# Patient Record
Sex: Male | Born: 1970 | Race: Black or African American | Hispanic: No | Marital: Single | Smoking: Current every day smoker
Health system: Southern US, Community
[De-identification: ages and names within clinical notes are randomized; demographics above are authoritative.]

## PROBLEM LIST (undated history)

## (undated) DIAGNOSIS — K219 Gastro-esophageal reflux disease without esophagitis: Secondary | ICD-10-CM

## (undated) HISTORY — DX: Gastro-esophageal reflux disease without esophagitis: K21.9

---

## 1999-04-01 ENCOUNTER — Emergency Department (HOSPITAL_COMMUNITY): Admission: EM | Admit: 1999-04-01 | Discharge: 1999-04-01 | Payer: Self-pay | Admitting: Emergency Medicine

## 2013-10-22 ENCOUNTER — Emergency Department (INDEPENDENT_AMBULATORY_CARE_PROVIDER_SITE_OTHER)
Admission: EM | Admit: 2013-10-22 | Discharge: 2013-10-22 | Disposition: A | Discharge status: Home / Self Care | Payer: Self-pay | Source: Home / Self Care | Attending: Emergency Medicine | Admitting: Emergency Medicine

## 2013-10-22 ENCOUNTER — Encounter (HOSPITAL_COMMUNITY): Payer: Self-pay | Admitting: Emergency Medicine

## 2013-10-22 ENCOUNTER — Emergency Department (INDEPENDENT_AMBULATORY_CARE_PROVIDER_SITE_OTHER): Payer: Self-pay

## 2013-10-22 DIAGNOSIS — S59909A Unspecified injury of unspecified elbow, initial encounter: Secondary | ICD-10-CM

## 2013-10-22 DIAGNOSIS — S59919A Unspecified injury of unspecified forearm, initial encounter: Secondary | ICD-10-CM

## 2013-10-22 DIAGNOSIS — S6990XA Unspecified injury of unspecified wrist, hand and finger(s), initial encounter: Secondary | ICD-10-CM

## 2013-10-22 DIAGNOSIS — S59911A Unspecified injury of right forearm, initial encounter: Secondary | ICD-10-CM

## 2013-10-22 MED ORDER — DICLOFENAC SODIUM 75 MG PO TBEC: 75.0000 mg | DELAYED_RELEASE_TABLET | Freq: Two times a day (BID) | ORAL | Status: DC

## 2013-10-22 NOTE — Discharge Instructions (Signed)
You can pick up the antiinflammatory medication at [facility]. It is twice daily. You can take Tylenol also if you need it. Wear the sling for comfort only, do no keep it on all the time. Keep the skin abrasion clean.  Follow up if you get worse or anything changes.
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
## 2013-10-23 NOTE — ED Provider Notes (Signed)
Medical screening examination/treatment/procedure(s) were performed by a resident physician and as supervising physician I was immediately available for consultation/collaboration.  Philipp Deputy, M.D.  Harden Mo, MD 10/23/13 [phone]

## 2017-08-03 ENCOUNTER — Encounter (HOSPITAL_COMMUNITY): Payer: Self-pay | Admitting: Emergency Medicine

## 2017-08-03 ENCOUNTER — Emergency Department (HOSPITAL_COMMUNITY): Payer: No Typology Code available for payment source

## 2017-08-03 ENCOUNTER — Emergency Department (HOSPITAL_COMMUNITY)
Admission: EM | Admit: 2017-08-03 | Discharge: 2017-08-03 | Disposition: A | Discharge status: Home / Self Care | Payer: No Typology Code available for payment source | Attending: Emergency Medicine | Admitting: Emergency Medicine

## 2017-08-03 DIAGNOSIS — Y9389 Activity, other specified: Secondary | ICD-10-CM | POA: Insufficient documentation

## 2017-08-03 DIAGNOSIS — W2209XA Striking against other stationary object, initial encounter: Secondary | ICD-10-CM | POA: Diagnosis not present

## 2017-08-03 DIAGNOSIS — Y929 Unspecified place or not applicable: Secondary | ICD-10-CM | POA: Diagnosis not present

## 2017-08-03 DIAGNOSIS — M25562 Pain in left knee: Secondary | ICD-10-CM

## 2017-08-03 DIAGNOSIS — S8002XA Contusion of left knee, initial encounter: Secondary | ICD-10-CM

## 2017-08-03 DIAGNOSIS — S8992XA Unspecified injury of left lower leg, initial encounter: Secondary | ICD-10-CM | POA: Diagnosis present

## 2017-08-03 DIAGNOSIS — Y999 Unspecified external cause status: Secondary | ICD-10-CM | POA: Diagnosis not present

## 2017-08-03 DIAGNOSIS — S8392XA Sprain of unspecified site of left knee, initial encounter: Secondary | ICD-10-CM | POA: Insufficient documentation

## 2017-08-03 MED ORDER — NAPROXEN 500 MG PO TABS: 500.0000 mg | ORAL_TABLET | Freq: Two times a day (BID) | ORAL | 0 refills | Status: DC | PRN

## 2017-08-03 MED ORDER — HYDROCODONE-ACETAMINOPHEN 5-325 MG PO TABS
1.0000 | ORAL_TABLET | Freq: Once | ORAL | Status: AC
  Administered 2017-08-03: 1 via ORAL
  Filled 2017-08-03: qty 1

## 2017-08-03 NOTE — ED Provider Notes (Signed)
[facility] DEPT Provider Note   CSN: [id number] Arrival date & time: 08/03/17  1836     History   Chief Complaint Chief Complaint  Patient presents with  . Knee Pain    HPI Matthew Perry is a 47 y.o. otherwise healthy male who presents to the ED with complaints of left knee pain x 5 hours.  Patient states that he was loading a beam into a truck and did not quite make it in so the beam fell, and in order to avoid being hit by the beam he attempted to jump over another being that was behind him, striking his left knee on the beam while jumping over it.  Since then he has had 10/10 constant aching nonradiating left knee pain which worsens with movement or walking, and with no treatments tried prior to arrival.  He reports associated swelling.  He denies any bruises, numbness, tingling, focal weakness, or any other injuries or complaints at this time.  He has never injured this knee before, and has no prior orthopedic care.  NKDA.  He denies having any medical conditions.    The history is provided by the patient and medical records. No language interpreter was used.  Knee Pain   This is a new problem. The current episode started 3 to 5 hours ago. The problem occurs constantly. The problem has not changed since onset.The pain is present in the left knee. The quality of the pain is described as aching. The pain is at a severity of 10/10. The pain is severe. Associated symptoms include limited range of motion (due to pain). Pertinent negatives include no numbness and no tingling. The symptoms are aggravated by standing and activity. He has tried nothing for the symptoms. The treatment provided no relief. There has been a history of trauma.    History reviewed. No pertinent past medical history.  There are no active problems to display for this patient.   History reviewed. No pertinent surgical history.     Home Medications    Prior to Admission  medications   Medication Sig Start Date End Date Taking? Authorizing Provider  diclofenac (VOLTAREN) 75 MG EC tablet Take 1 tablet (75 mg total) by mouth 2 (two) times daily. 10/22/13   Hairford, Tyler Pita, MD    Family History No family history on file.  Social History Social History   Tobacco Use  . Smoking status: Never Smoker  Substance Use Topics  . Alcohol use: Yes  . Drug use: No     Allergies   Patient has no known allergies.   Review of Systems Review of Systems  Musculoskeletal: Positive for arthralgias and joint swelling.  Skin: Negative for color change.  Allergic/Immunologic: Negative for immunocompromised state.  Neurological: Negative for tingling, weakness and numbness.  Psychiatric/Behavioral: Negative for confusion.     Physical Exam Updated Vital Signs BP (!) 155/97 (BP Location: Left Arm)   Pulse 69   Temp 98.3 F (36.8 C) (Oral)   Resp 14   SpO2 99%   Physical Exam  Constitutional: He is oriented to person, place, and time. Vital signs are normal. He appears well-developed and well-nourished.  Non-toxic appearance. No distress.  Afebrile, nontoxic, NAD  HENT:  Head: Normocephalic and atraumatic.  Mouth/Throat: Mucous membranes are normal.  Eyes: Conjunctivae and EOM are normal. Right eye exhibits no discharge. Left eye exhibits no discharge.  Neck: Normal range of motion. Neck supple.  Cardiovascular: Normal rate and intact distal pulses.  Pulmonary/Chest: Effort normal. No respiratory distress.  Abdominal: Normal appearance. He exhibits no distension.  Musculoskeletal:       Left knee: He exhibits decreased range of motion (due to pain), swelling and effusion. He exhibits no ecchymosis, no deformity, no laceration, no erythema, normal alignment, no LCL laxity, normal patellar mobility and no MCL laxity. Tenderness found. Medial joint line and lateral joint line tenderness noted.  L knee with limited ROM due to pain, with diffuse joint line TTP,  +swelling/effusion, no deformity or crepitus, no abrasions, no bruising or erythema, no warmth, no abnormal alignment or patellar mobility, no varus/valgus laxity, neg anterior drawer test.  Strength and sensation grossly intact distally, distal pulses intact, compartments soft   Neurological: He is alert and oriented to person, place, and time. He has normal strength. No sensory deficit.  Skin: Skin is warm, dry and intact. No rash noted.  Psychiatric: He has a normal mood and affect.  Nursing note and vitals reviewed.    ED Treatments / Results  Labs (all labs ordered are listed, but only abnormal results are displayed) Labs Reviewed - No data to display  EKG  EKG Interpretation None       Radiology Dg Knee Complete 4 Views Left  Result Date: 08/03/2017 CLINICAL DATA:  Left knee injury with pain. EXAM: LEFT KNEE - COMPLETE 4+ VIEW COMPARISON:  None. FINDINGS: No evidence of fracture, dislocation, or joint effusion. There is minimal tibial spine osteophytosis. Soft tissues are unremarkable. IMPRESSION: There is no acute fracture or dislocation. Electronically Signed   By: Abelardo Diesel M.D.   On: 08/03/2017 20:13    Procedures Procedures (including critical care time)  Medications Ordered in ED Medications  HYDROcodone-acetaminophen (NORCO/VICODIN) 5-325 MG per tablet 1 tablet (1 tablet Oral Given 08/03/17 1935)     Initial Impression / Assessment and Plan / ED Course  I have reviewed the triage vital signs and the nursing notes.  Pertinent labs & imaging results that were available during my care of the patient were reviewed by me and considered in my medical decision making (see chart for details).     47 y.o. male here with L knee pain after striking it on a beam while trying to jump over it to avoid being hit by another beam that was falling. On exam, diffuse joint line TTP and swelling of the L knee, no bruising or abrasions, no erythema or warmth, ROM limited due to  pain, NVI with soft compartments. Will get xray and give pain meds, then reassess shortly.   8:23 PM L knee xray negative. Likely knee contusion vs sprain. Doubt occult tibial plateau fx given mechanism. Will provide knee sleeve and crutches, advised RICE, will rx naprosyn and discussed additional tylenol use, and f/up with ortho in 1wk for recheck of symptoms. I explained the diagnosis and have given explicit precautions to return to the ER including for any other new or worsening symptoms. The patient understands and accepts the medical plan as it's been dictated and I have answered their questions. Discharge instructions concerning home care and prescriptions have been given. The patient is STABLE and is discharged to home in good condition.    Final Clinical Impressions(s) / ED Diagnoses   Final diagnoses:  Acute pain of left knee  Sprain of left knee, unspecified ligament, initial encounter  Contusion of left knee, initial encounter    ED Discharge Orders        Ordered    naproxen (NAPROSYN) 500 MG  tablet  2 times daily PRN     08/03/17 [address] [place], [place] 08/03/17 2025    Little, Wenda Overland, MD 08/04/17 [phone]

## 2017-08-03 NOTE — Discharge Instructions (Addendum)
Wear knee sleeve for at least 2 weeks for stabilization of knee. Use crutches as needed for comfort. Ice and elevate knee throughout the day, using ice pack for no more than 20 minutes every hour. Take naprosyn as directed as needed for pain and inflammation, and  use tylenol for additional pain relief. Call orthopedic follow up today or tomorrow to schedule followup appointment for recheck of ongoing knee pain in 1-2 weeks. Return to the ER for changes or worsening symptoms.

## 2017-08-03 NOTE — ED Triage Notes (Signed)
Per [facility] pt coming from home states a metal beam fell and caused him to jump. Patient did not get hit by beam. C/o left knee pain.

## 2018-07-30 ENCOUNTER — Other Ambulatory Visit: Payer: Self-pay

## 2018-07-30 ENCOUNTER — Ambulatory Visit (INDEPENDENT_AMBULATORY_CARE_PROVIDER_SITE_OTHER): Payer: PRIVATE HEALTH INSURANCE

## 2018-07-30 ENCOUNTER — Encounter (HOSPITAL_COMMUNITY): Payer: Self-pay | Admitting: Emergency Medicine

## 2018-07-30 ENCOUNTER — Ambulatory Visit (HOSPITAL_COMMUNITY)
Admission: EM | Admit: 2018-07-30 | Discharge: 2018-07-30 | Disposition: A | Discharge status: Home / Self Care | Payer: PRIVATE HEALTH INSURANCE | Attending: Urgent Care | Admitting: Urgent Care

## 2018-07-30 DIAGNOSIS — S335XXA Sprain of ligaments of lumbar spine, initial encounter: Secondary | ICD-10-CM | POA: Insufficient documentation

## 2018-07-30 DIAGNOSIS — M79605 Pain in left leg: Secondary | ICD-10-CM | POA: Insufficient documentation

## 2018-07-30 DIAGNOSIS — M5136 Other intervertebral disc degeneration, lumbar region: Secondary | ICD-10-CM | POA: Insufficient documentation

## 2018-07-30 DIAGNOSIS — M545 Low back pain: Secondary | ICD-10-CM | POA: Diagnosis not present

## 2018-07-30 MED ORDER — METHYLPREDNISOLONE SODIUM SUCC 125 MG IJ SOLR
125.0000 mg | Freq: Once | INTRAMUSCULAR | Status: AC
  Administered 2018-07-30: 125 mg via INTRAMUSCULAR

## 2018-07-30 MED ORDER — METHYLPREDNISOLONE SODIUM SUCC 125 MG IJ SOLR
INTRAMUSCULAR | Status: AC
  Filled 2018-07-30: qty 2

## 2018-07-30 MED ORDER — MELOXICAM 7.5 MG PO TABS: 7.5000 mg | ORAL_TABLET | Freq: Every day | ORAL | 1 refills | Status: DC

## 2018-07-30 MED ORDER — CYCLOBENZAPRINE HCL 5 MG PO TABS: 5.0000 mg | ORAL_TABLET | Freq: Two times a day (BID) | ORAL | 0 refills | Status: DC | PRN

## 2018-07-30 NOTE — ED Triage Notes (Signed)
Pt reports left lower back pain that radiates into his hip and into his left leg to his mid thigh, laterally.  He states he tripped up some steps two months ago and the pain started then.  Pt has been using a heating pad and NSAIDS OTC with no relief.

## 2018-07-30 NOTE — ED Provider Notes (Addendum)
MRN: [id number] DOB: [birth year]  Subjective:   Matthew Perry is a 48 y.o. male presenting for 2 month history of worsening, persistent, constant, sharp/achy/dull/throbbing left low back with radiation into left thigh. Symptoms started from falling up some steps accidentally. Has tried naproxen, diclofenac with minimal relief; these are old prescriptions. Has not seen a provider for this. He is not currently taking any medications and has no known food or drug allergies.  Denies past medical and surgical history.  Review of Systems  Constitutional: Negative for chills, fever and malaise/fatigue.  HENT: Negative for ear pain and sore throat.   Eyes: Negative for blurred vision and double vision.  Respiratory: Negative for cough, shortness of breath and wheezing.   Cardiovascular: Negative for chest pain and palpitations.  Gastrointestinal: Negative for abdominal pain, blood in stool, constipation, nausea and vomiting.  Genitourinary: Negative for dysuria, flank pain, frequency, hematuria and urgency.  Skin: Negative for rash.  Neurological: Negative for dizziness and headaches.  Psychiatric/Behavioral: Negative for depression.   Objective:   Vitals: BP (!) 149/92 (BP Location: Left Arm) Comment: Reported BP to PA [facility]  Pulse 64   Temp 98.8 F (37.1 C) (Oral)   Resp 18   SpO2 100%   Physical Exam Constitutional:      Appearance: Normal appearance. He is well-developed and normal weight.  HENT:     Head: Normocephalic and atraumatic.     Right Ear: External ear normal.     Left Ear: External ear normal.     Nose: Nose normal.     Mouth/Throat:     Pharynx: Oropharynx is clear.  Eyes:     Extraocular Movements: Extraocular movements intact.     Pupils: Pupils are equal, round, and reactive to light.  Cardiovascular:     Rate and Rhythm: Normal rate.  Pulmonary:     Effort: Pulmonary effort is normal.  Musculoskeletal:     Lumbar back: He exhibits decreased range of  motion, tenderness and spasm. He exhibits no swelling, no edema, no deformity and no laceration.       Back:  Neurological:     Mental Status: He is alert and oriented to person, place, and time.  Psychiatric:        Mood and Affect: Mood normal.        Behavior: Behavior normal.    Lumbar XR - preliminary reading by PA [facility] shows disc space narrowing at L3-L4, osteophytes anteriorly over same area. Over-read is pending.  Dg Lumbar Spine Complete  Result Date: 07/30/2018 CLINICAL DATA:  Recent trip and fall with left lower lumbar pain, initial encounter EXAM: LUMBAR SPINE - COMPLETE 4+ VIEW COMPARISON:  None. FINDINGS: Five lumbar type vertebral bodies are well visualized. Vertebral body height is well maintained. No pars defects are seen. No anterolisthesis is noted. Mild disc space narrowing at L3-4 and L4-5 is seen. IMPRESSION: Mild degenerative change without acute abnormality. Electronically Signed   By: Inez Catalina M.D.   On: 07/30/2018 10:44     Assessment and Plan :   Lumbar sprain, initial encounter  Low back pain radiating to left leg  Degenerative disc disease, lumbar  IM Solu-Medrol in clinic today.  Use meloxicam and Flexeril at home.  Counseled on back care, modification of activities.  Patient will have to days of rest from work.  Recommended he establish care with the PCP for follow-up if his back pain persist. Counseled patient on potential for adverse effects with medications prescribed today, patient  verbalized understanding.  Monitor blood pressure at home which I suspect is due to his pain and use of NSAIDs. Return-to-clinic precautions discussed, patient verbalized understanding.    Jaynee Eagles, PA-C 07/30/18 [place], [place] 07/30/18 1102

## 2019-06-23 ENCOUNTER — Ambulatory Visit (HOSPITAL_COMMUNITY)
Admission: EM | Admit: 2019-06-23 | Discharge: 2019-06-23 | Disposition: A | Discharge status: Home / Self Care | Payer: PRIVATE HEALTH INSURANCE | Attending: Family Medicine | Admitting: Family Medicine

## 2019-06-23 ENCOUNTER — Encounter (HOSPITAL_COMMUNITY): Payer: Self-pay

## 2019-06-23 ENCOUNTER — Other Ambulatory Visit: Payer: Self-pay

## 2019-06-23 DIAGNOSIS — R1013 Epigastric pain: Secondary | ICD-10-CM

## 2019-06-23 MED ORDER — OMEPRAZOLE 20 MG PO CPDR: 20.0000 mg | DELAYED_RELEASE_CAPSULE | Freq: Two times a day (BID) | ORAL | 0 refills | Status: DC

## 2019-06-23 MED ORDER — LIDOCAINE VISCOUS HCL 2 % MT SOLN
15.0000 mL | Freq: Once | OROMUCOSAL | Status: AC
  Administered 2019-06-23: 15 mL via ORAL

## 2019-06-23 MED ORDER — ALUM & MAG HYDROXIDE-SIMETH 200-200-20 MG/5ML PO SUSP
30.0000 mL | Freq: Once | ORAL | Status: AC
  Administered 2019-06-23: 18:00:00 30 mL via ORAL

## 2019-06-23 NOTE — ED Provider Notes (Addendum)
[facility]    CSN: [id number] Arrival date & time: 06/23/19  1620      History   Chief Complaint Chief Complaint  Patient presents with   Chest Pain    HPI Matthew Perry is a 48 y.o. male.   HPI  Patient complains of "chest pain".  He points to his mid epigastrium.  He states is been present for about 3 weeks.  Is getting worse.  It hurts more at night when he tries to lie down.  He does have some nausea.  Appetite diminished.  No vomiting.  No change with meals.  He is a smoker.  No alcohol.  No history of stomach problems or acid reflux.  No history of heart disease.  No hypertension, diabetes, or family history of heart disease.  Pain is not exertional.    No lightheadedness or diaphoresis. Occasionally when the pain is severe it radiates to his back No history of abdominal surgeries  History reviewed. No pertinent past medical history.  There are no active problems to display for this patient.   History reviewed. No pertinent surgical history.     Home Medications    Prior to Admission medications   Medication Sig Start Date End Date Taking? Authorizing Provider  omeprazole (PRILOSEC) 20 MG capsule Take 1 capsule (20 mg total) by mouth 2 (two) times daily before a meal. 06/23/19   Raylene Everts, MD    Family History Family History  Problem Relation Age of Onset   Multiple sclerosis Mother     Social History Social History   Tobacco Use   Smoking status: Current Every Day Smoker    Packs/day: 0.25    Types: Cigarettes    Last attempt to quit: 07/30/2018    Years since quitting: 0.8   Smokeless tobacco: Never Used  Substance Use Topics   Alcohol use: Not Currently   Drug use: Yes    Types: Marijuana     Allergies   Patient has no known allergies.   Review of Systems Review of Systems  Constitutional: Negative for chills and fever.  HENT: Negative for ear pain and sore throat.   Eyes: Negative for pain and visual  disturbance.  Respiratory: Negative for cough and shortness of breath.   Cardiovascular: Positive for chest pain. Negative for palpitations.  Gastrointestinal: Positive for abdominal pain and nausea. Negative for vomiting.  Genitourinary: Negative for dysuria and hematuria.  Musculoskeletal: Negative for arthralgias and back pain.  Skin: Negative for color change and rash.  Neurological: Negative for seizures and syncope.  All other systems reviewed and are negative.    Physical Exam Triage Vital Signs ED Triage Vitals  Enc Vitals Group     BP 06/23/19 1657 133/83     Pulse Rate 06/23/19 1657 69     Resp 06/23/19 1657 16     Temp 06/23/19 1657 98.7 F (37.1 C)     Temp Source 06/23/19 1657 Oral     SpO2 06/23/19 1657 100 %     Weight --      Height --      Head Circumference --      Peak Flow --      Pain Score 06/23/19 1655 4     Pain Loc --      Pain Edu? --      Excl. in [place]? --    No data found.  Updated Vital Signs BP 133/83 (BP Location: Right Arm)    Pulse  69    Temp 98.7 F (37.1 C) (Oral)    Resp 16    SpO2 100%       Physical Exam Constitutional:      General: He is not in acute distress.    Appearance: He is well-developed and normal weight.     Comments: Smells of tobacco  HENT:     Head: Normocephalic and atraumatic.  Eyes:     Conjunctiva/sclera: Conjunctivae normal.     Pupils: Pupils are equal, round, and reactive to light.  Neck:     Musculoskeletal: Normal range of motion.  Cardiovascular:     Rate and Rhythm: Normal rate and regular rhythm.     Heart sounds: Normal heart sounds. No systolic murmur.  Pulmonary:     Effort: Pulmonary effort is normal. No respiratory distress.     Comments: Lungs are clear Abdominal:     General: There is no distension.     Palpations: Abdomen is soft. There is no hepatomegaly or splenomegaly.     Tenderness: There is abdominal tenderness.     Comments: Tenderness to palpation of the epigastrium.  No  guarding or rebound.  No organomegaly  Musculoskeletal: Normal range of motion.  Lymphadenopathy:     Cervical: No cervical adenopathy.  Skin:    General: Skin is warm and dry.  Neurological:     General: No focal deficit present.     Mental Status: He is alert.  Psychiatric:        Mood and Affect: Mood normal.        Behavior: Behavior normal.      UC Treatments / Results  Labs (all labs ordered are listed, but only abnormal results are displayed) Labs Reviewed - No data to display  EKG EKG shows normal sinus rhythm, normal intervals, no ST or T wave changes   Radiology No results found.  Procedures Procedures (including critical care time)  Medications Ordered in UC Medications  alum & mag hydroxide-simeth (MAALOX/MYLANTA) 200-200-20 MG/5ML suspension 30 mL (30 mLs Oral Given 06/23/19 1749)    And  lidocaine (XYLOCAINE) 2 % viscous mouth solution 15 mL (15 mLs Oral Given 06/23/19 1749)    Initial Impression / Assessment and Plan / UC Course  I have reviewed the triage vital signs and the nursing notes.  Pertinent labs & imaging results that were available during my care of the patient were reviewed by me and considered in my medical decision making (see chart for details).     Patient improved after GI cocktail Final Clinical Impressions(s) / UC Diagnoses   Final diagnoses:  Dyspepsia     Discharge Instructions     Take the acid reducing pill 2 times a day Avoid spicy foods Try to reduce your cigarette smoking Take antacids like Maalox or Mylanta when your stomach hurts Follow-up with your primary care doctor    ED Prescriptions    Medication Sig Dispense Auth. Provider   omeprazole (PRILOSEC) 20 MG capsule Take 1 capsule (20 mg total) by mouth 2 (two) times daily before a meal. 60 capsule Raylene Everts, MD     PDMP not reviewed this encounter.   Raylene Everts, MD 06/23/19 1740    Raylene Everts, MD 06/23/19 [phone]

## 2019-06-23 NOTE — ED Triage Notes (Signed)
Patient presents to Urgent Care with complaints of shooting centralized chest pain that radiates to his back since 3 weeks ago. Patient reports it is worst at night when he lays flat.

## 2019-06-23 NOTE — Discharge Instructions (Addendum)
Take the acid reducing pill 2 times a day Avoid spicy foods Try to reduce your cigarette smoking Take antacids like Maalox or Mylanta when your stomach hurts Follow-up with your primary care doctor

## 2019-11-12 ENCOUNTER — Ambulatory Visit (HOSPITAL_COMMUNITY)
Admission: EM | Admit: 2019-11-12 | Discharge: 2019-11-12 | Disposition: A | Discharge status: Home / Self Care | Payer: PRIVATE HEALTH INSURANCE | Attending: Physician Assistant | Admitting: Physician Assistant

## 2019-11-12 ENCOUNTER — Other Ambulatory Visit: Payer: Self-pay

## 2019-11-12 DIAGNOSIS — M79601 Pain in right arm: Secondary | ICD-10-CM | POA: Diagnosis not present

## 2019-11-12 DIAGNOSIS — M5412 Radiculopathy, cervical region: Secondary | ICD-10-CM

## 2019-11-12 MED ORDER — IBUPROFEN 800 MG PO TABS: 800.0000 mg | ORAL_TABLET | Freq: Three times a day (TID) | ORAL | 0 refills | Status: DC

## 2019-11-12 MED ORDER — PREDNISONE 50 MG PO TABS: 50.0000 mg | ORAL_TABLET | Freq: Every day | ORAL | 0 refills | Status: DC

## 2019-11-12 MED ORDER — TIZANIDINE HCL 4 MG PO TABS: 4.0000 mg | ORAL_TABLET | Freq: Four times a day (QID) | ORAL | 0 refills | Status: DC | PRN

## 2019-11-12 NOTE — ED Triage Notes (Signed)
Pain (tingling) that starts in neck and radiates down right arm x 3 weeks

## 2019-11-12 NOTE — Discharge Instructions (Addendum)
I want you to take the prednisone: 50mg  daily for 5 days, then following completion of this, begin the ibuprofen as needed every 8 hours  Take the muscle relaxer at night. This will make you sleepy, so do not drive or drink after taking it.  Follow up with the sports medicine group for continued management and re-evaluation in 1-2 weeks  Establish with primary care to have your blood pressure rechecked

## 2019-11-12 NOTE — ED Provider Notes (Signed)
[facility]    CSN: [id number] Arrival date & time: 11/12/19  1731      History   Chief Complaint Chief Complaint  Patient presents with  . Arm Pain    HPI Matthew Perry is a 49 y.o. male.   Patient presents with 3-week history of neck pain and shooting pains down his right arm.  Reports it started as a twinge in his right side of his neck.  Which progressed to his symptoms as current.  He describes today pain starting in the right side of his neck and shooting down his right shoulder into his arm.  He describes pins-and-needles sensation in the back of his right arm.  He describes decreased sensation in his right hand.  Reports this is worse when sitting upright with his arm dangling.  Reports relief with putting his arm over his head at night and laying on the right side.  Denies injury to the neck or shoulder.  Denies any known inciting event.  Denies weakness.     No past medical history on file.  There are no problems to display for this patient.   No past surgical history on file.     Home Medications    Prior to Admission medications   Medication Sig Start Date End Date Taking? Authorizing Provider  ibuprofen (ADVIL) 800 MG tablet Take 1 tablet (800 mg total) by mouth 3 (three) times daily. 11/12/19   Rahma Meller, Marguerita Beards, PA-C  omeprazole (PRILOSEC) 20 MG capsule Take 1 capsule (20 mg total) by mouth 2 (two) times daily before a meal. 06/23/19   Raylene Everts, MD  predniSONE (DELTASONE) 50 MG tablet Take 1 tablet (50 mg total) by mouth daily with breakfast. 11/12/19   Ladon Heney, Marguerita Beards, PA-C  tiZANidine (ZANAFLEX) 4 MG tablet Take 1 tablet (4 mg total) by mouth every 6 (six) hours as needed for muscle spasms. 11/12/19   Analise Glotfelty, Marguerita Beards, PA-C    Family History Family History  Problem Relation Age of Onset  . Multiple sclerosis Mother     Social History Social History   Tobacco Use  . Smoking status: Current Every Day Smoker    Packs/day: 0.25    Types:  Cigarettes    Last attempt to quit: 07/30/2018    Years since quitting: 1.2  . Smokeless tobacco: Never Used  Substance Use Topics  . Alcohol use: Not Currently  . Drug use: Yes    Types: Marijuana     Allergies   Patient has no known allergies.   Review of Systems Review of Systems   Physical Exam Triage Vital Signs ED Triage Vitals [11/12/19 1756]  Enc Vitals Group     BP (!) 163/105     Pulse Rate 67     Resp 16     Temp 98 F (36.7 C)     Temp src      SpO2 97 %     Weight      Height      Head Circumference      Peak Flow      Pain Score 8     Pain Loc      Pain Edu?      Excl. in [employer] [place]?    No data found.  Updated Vital Signs BP (!) 163/105   Pulse 67   Temp 98 F (36.7 C)   Resp 16   SpO2 97%   Visual Acuity Right Eye Distance:   Left Eye  Distance:   Bilateral Distance:    Right Eye Near:   Left Eye Near:    Bilateral Near:     Physical Exam Vitals and nursing note reviewed.  Constitutional:      Appearance: He is well-developed.  HENT:     Head: Normocephalic and atraumatic.  Neck:     Comments: Tenderness palpation over the mid cervical region on the right side.  No midline tenderness.  Patient has full range of motion however elicited pain with lateral flexion to the right.  Pain with forward flexion.  No pain with lateral flexion to the left or extension.  No pain elicited with compression of the cervical spine. Cardiovascular:     Rate and Rhythm: Normal rate.  Pulmonary:     Effort: Pulmonary effort is normal. No respiratory distress.  Musculoskeletal:     Comments: Muscle bulk is equal bilaterally.  Tender to palpation throughout the right trapezius and posterior shoulder.  No tenderness palpation of the musculature the right arm.   Some relief of pain with hand overhead.  Strength 5/5 throughout the upper extremities bilaterally.  To include grip and finger extension with thumb opposition  Skin:    General: Skin is warm and  dry.     Capillary Refill: Capillary refill takes less than 2 seconds.  Neurological:     Mental Status: He is alert.     Comments: There is some decrease sensation in the right hand versus the left.  Mild decreased sensation over the posterior aspect of the tricep and forearm.      UC Treatments / Results  Labs (all labs ordered are listed, but only abnormal results are displayed) Labs Reviewed - No data to display  EKG   Radiology No results found.  Procedures Procedures (including critical care time)  Medications Ordered in UC Medications - No data to display  Initial Impression / Assessment and Plan / UC Course  I have reviewed the triage vital signs and the nursing notes.  Pertinent labs & imaging results that were available during my care of the patient were reviewed by me and considered in my medical decision making (see chart for details).     #Cervical radiculopathy #Right arm pain Patient is a 49 year old gentleman with symptoms consistent with a cervical radiculopathy to the right side.  No history of trauma or inciting event, will forego x-ray.  Will start on 50 mg daily x5-day prednisone regiment with following ibuprofen.  Will give muscle relaxer.  Follow-up with sports medicine recommended.  Primary care follow-up option was also given.  Patient verbalized understanding plan. Final Clinical Impressions(s) / UC Diagnoses   Final diagnoses:  Cervical radiculopathy  Right arm pain     Discharge Instructions     I want you to take the prednisone: 50mg  daily for 5 days, then following completion of this, begin the ibuprofen as needed every 8 hours  Take the muscle relaxer at night. This will make you sleepy, so do not drive or drink after taking it.  Follow up with the sports medicine group for continued management and re-evaluation in 1-2 weeks  Establish with primary care to have your blood pressure rechecked      ED Prescriptions    Medication  Sig Dispense Auth. Provider   predniSONE (DELTASONE) 50 MG tablet Take 1 tablet (50 mg total) by mouth daily with breakfast. 5 tablet Cheria Sadiq, Marguerita Beards, PA-C   ibuprofen (ADVIL) 800 MG tablet Take 1 tablet (800 mg total)  by mouth 3 (three) times daily. 21 tablet Prinston Kynard, Marguerita Beards, PA-C   tiZANidine (ZANAFLEX) 4 MG tablet Take 1 tablet (4 mg total) by mouth every 6 (six) hours as needed for muscle spasms. 30 tablet Helayne Metsker, Marguerita Beards, PA-C     PDMP not reviewed this encounter.   Purnell Shoemaker, PA-C 11/12/19 1910

## 2019-12-22 NOTE — Patient Instructions (Addendum)
It was a pleasure meeting you.  You were seen to establish care and right shoulder numbness  I have ordered an xray of your neck.   You also received your Tdapt injection today I have sent a referral to GI for colonoscopy  I encourage you to consider quitting smoking.  I am happy to help you with this process.  Please follow up in 4-6 weeks   Carollee Leitz, MD [facility]    Cervical Radiculopathy  Cervical radiculopathy means that a nerve in the neck (a cervical nerve) is pinched or bruised. This can happen because of an injury to the cervical spine (vertebrae) in the neck, or as a normal part of getting older. This can cause pain or loss of feeling (numbness) that runs from your neck all the way down to your arm and fingers. Often, this condition gets better with rest. Treatment may be needed if the condition does not get better. What are the causes?  A neck injury.  A bulging disk in your spine.  Muscle movements that you cannot control (muscle spasms).  Tight muscles in your neck due to overuse.  Arthritis.  Breakdown in the bones and joints of the spine (spondylosis) due to getting older.  Bone spurs that form near the nerves in the neck. What are the signs or symptoms?  Pain. The pain may: ? Run from the neck to the arm and hand. ? Be very bad or irritating. ? Be worse when you move your neck.  Loss of feeling or tingling in your arm or hand.  Weakness in your arm or hand, in very bad cases. How is this treated? In many cases, treatment is not needed for this condition. With rest, the condition often gets better over time. If treatment is needed, options may include:  Wearing a soft neck collar (cervical collar) for short periods of time, as told by your doctor.  Doing exercises (physical therapy) to strengthen your neck muscles.  Taking medicines.  Having shots (injections) in your spine, in very bad cases.  Having surgery. This may be needed  if other treatments do not help. The type of surgery that is used depends on the cause of your condition. Follow these instructions at home: If you have a soft neck collar:  Wear it as told by your doctor. Remove it only as told by your doctor.  Ask your doctor if you can remove the collar for cleaning and bathing. If you are allowed to remove the collar for cleaning or bathing: ? Follow instructions from your doctor about how to remove the collar safely. ? Clean the collar by wiping it with mild soap and water and drying it completely. ? Take out any removable pads in the collar every 1-2 days. Wash them by hand with soap and water. Let them air-dry completely before you put them back in the collar. ? Check your skin under the collar for redness or sores. If you see any, tell your doctor. Managing pain      Take over-the-counter and prescription medicines only as told by your doctor.  If told, put ice on the painful area. ? If you have a soft neck collar, remove it as told by your doctor. ? Put ice in a plastic bag. ? Place a towel between your skin and the bag. ? Leave the ice on for 20 minutes, 2-3 times a day.  If using ice does not help, you can try using heat. Use the heat  source that your doctor recommends, such as a moist heat pack or a heating pad. ? Place a towel between your skin and the heat source. ? Leave the heat on for 20-30 minutes. ? Remove the heat if your skin turns bright red. This is very important if you are unable to feel pain, heat, or cold. You may have a greater risk of getting burned.  You may try a gentle neck and shoulder rub (massage). Activity  Rest as needed.  Return to your normal activities as told by your doctor. Ask your doctor what activities are safe for you.  Do exercises as told by your doctor or physical therapist.  Do not lift anything that is heavier than 10 lb (4.5 kg) until your doctor tells you that it is safe. General  instructions  Use a flat pillow when you sleep.  Do not drive while wearing a soft neck collar. If you do not have a soft neck collar, ask your doctor if it is safe to drive while your neck heals.  Ask your doctor if the medicine prescribed to you requires you to avoid driving or using heavy machinery.  Do not use any products that contain nicotine or tobacco, such as cigarettes, e-cigarettes, and chewing tobacco. These can delay healing. If you need help quitting, ask your doctor.  Keep all follow-up visits as told by your doctor. This is important. Contact a doctor if:  Your condition does not get better with treatment. Get help right away if:  Your pain gets worse and is not helped with medicine.  You lose feeling or feel weak in your hand, arm, face, or leg.  You have a high fever.  You have a stiff neck.  You cannot control when you poop or pee (have incontinence).  You have trouble with walking, balance, or talking. Summary  Cervical radiculopathy means that a nerve in the neck is pinched or bruised.  A nerve can get pinched from a bulging disk, arthritis, an injury to the neck, or other causes.  Symptoms include pain, tingling, or loss of feeling that goes from the neck into the arm or hand.  Weakness in your arm or hand can happen in very bad cases.  Treatment may include resting, wearing a soft neck collar, and doing exercises. You might need to take medicines for pain. In very bad cases, shots or surgery may be needed. This information is not intended to replace advice given to you by your health care provider. Make sure you discuss any questions you have with your health care provider. Document Revised: 05/25/2018 Document Reviewed: 05/25/2018 Elsevier Patient Education  2020 [employer].

## 2019-12-22 NOTE — Progress Notes (Signed)
° ° °  SUBJECTIVE:   CHIEF COMPLAINT / HPI:  To establish care  Right shoulder and arm numbness Patient reports right shoulder and arm numbness for about 1.5 months.  He woke up one morning and felt like his neck had a twinge.  He thought this would get better with some exercise but did not and started having numbness and tingling radiating down right arm. He went to the ED in April and was thought to have cervical radiculopathy.  No images done at that time.  Recommended Sports Medicine evaluation but patient reports he has not seen them.  He reports decrease in sensation R>L.  He also reports having feeling weaker in right arm.  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     PERTINENT  PMH / PSH:  MedHx: none SurgHx: none FmHx: maternal HTN SHx: ETOH 2 beer every other week, Tobacco 1ppd if having bad day, 10cigs daily normally.  Interested in smoking cessation.  Sexually active with wife. Medications: Ibuprofen prn   OBJECTIVE:   BP 126/82    Pulse 66    Ht 5\' 10"  (1.778 m)    Wt 187 lb 8 oz (85 kg)    SpO2 97%    BMI 26.90 kg/m   General: Alert and oriented, no apparent distress  Neck: nontender to palpation, stretch pain elicited upon flexion.   MSK: Upper extremity strength 5/5 bilaterally, Lower extremity strength 5/5 bilaterally Neuro: mild decrease in sensation noted to Right arm extenting from shoulder to tip of thumb abd forefinger, Tinel's and Phalens negative.  ROM intact, motor intact  Derm: No rashes noted    ASSESSMENT/PLAN:   Cervical radiculopathy -xray cervical spine -Consider SM for evaluation after receive xray results -Patient refused trail Gabapentin -Continue to monitor -Continue Ibuprofen and Tylenol -Consider PT if no better -Follow up with PCP in 4 weeks     Carollee Leitz, MD [place]

## 2019-12-23 ENCOUNTER — Ambulatory Visit (INDEPENDENT_AMBULATORY_CARE_PROVIDER_SITE_OTHER): Payer: PRIVATE HEALTH INSURANCE | Admitting: Family Medicine

## 2019-12-23 ENCOUNTER — Ambulatory Visit (HOSPITAL_COMMUNITY)
Admission: RE | Admit: 2019-12-23 | Discharge: 2019-12-23 | Disposition: A | Discharge status: Home / Self Care | Payer: PRIVATE HEALTH INSURANCE | Source: Ambulatory Visit | Attending: Family Medicine | Admitting: Family Medicine

## 2019-12-23 ENCOUNTER — Other Ambulatory Visit: Payer: Self-pay

## 2019-12-23 ENCOUNTER — Encounter: Payer: Self-pay | Admitting: Family Medicine

## 2019-12-23 VITALS — BP 126/82 | HR 66 | Ht 70.0 in | Wt 187.5 lb

## 2019-12-23 DIAGNOSIS — Z23 Encounter for immunization: Secondary | ICD-10-CM

## 2019-12-23 DIAGNOSIS — M5412 Radiculopathy, cervical region: Secondary | ICD-10-CM | POA: Insufficient documentation

## 2019-12-23 DIAGNOSIS — Z0000 Encounter for general adult medical examination without abnormal findings: Secondary | ICD-10-CM | POA: Diagnosis not present

## 2019-12-23 NOTE — Assessment & Plan Note (Signed)
-  xray cervical spine -Consider SM for evaluation after receive xray results -Patient refused trail Gabapentin -Continue to monitor -Continue Ibuprofen and Tylenol -Consider PT if no better -Follow up with PCP in 4 weeks

## 2019-12-24 ENCOUNTER — Telehealth: Payer: Self-pay | Admitting: Family Medicine

## 2019-12-24 ENCOUNTER — Encounter: Payer: Self-pay | Admitting: Family Medicine

## 2019-12-24 NOTE — Telephone Encounter (Signed)
LVM to call office back to inform pt of below. Matthew Perry, CMA  

## 2019-12-25 NOTE — Telephone Encounter (Signed)
Contacted pt and informed him of below and appointment scheduled for next Tuesday.Kerry Chisolm Zimmerman Rumple, CMA

## 2019-12-27 ENCOUNTER — Encounter: Payer: Self-pay | Admitting: Gastroenterology

## 2019-12-30 NOTE — Patient Instructions (Addendum)
It was a pleasure seeing you today.  You were seen for shoulder pain  I sent a referral to Sports Medicine for evaluate.  They will call you with an appointment  Take Flexeril to take at night.  DO NOT take Zanaflex while taking Flexeril. Apply Voltaren gel 4 times a day and take Tylenol every twice a day   Carollee Leitz, MD [facility]    Muscle Strain A muscle strain is an injury that happens when a muscle is stretched longer than normal. This can happen during a fall, sports, or lifting. This can tear some muscle fibers. Usually, recovery from muscle strain takes 1-2 weeks. Complete healing normally takes 5-6 weeks. This condition is first treated with PRICE therapy. This involves:  Protecting your muscle from being injured again.  Resting your injured muscle.  Icing your injured muscle.  Applying pressure (compression) to your injured muscle. This may be done with a splint or elastic bandage.  Raising (elevating) your injured muscle. Your doctor may also recommend medicine for pain. Follow these instructions at home: If you have a splint:  Wear the splint as told by your doctor. Take it off only as told by your doctor.  Loosen the splint if your fingers or toes tingle, get numb, or turn cold and blue.  Keep the splint clean.  If the splint is not waterproof: ? Do not let it get wet. ? Cover it with a watertight covering when you take a bath or a shower. Managing pain, stiffness, and swelling   If directed, put ice on your injured area. ? If you have a removable splint, take it off as told by your doctor. ? Put ice in a plastic bag. ? Place a towel between your skin and the bag. ? Leave the ice on for 20 minutes, 2-3 times a day.  Move your fingers or toes often. This helps to avoid stiffness and lessen swelling.  Raise your injured area above the level of your heart while you are sitting or lying down.  Wear an elastic bandage as told by your  doctor. Make sure it is not too tight. General instructions  Take over-the-counter and prescription medicines only as told by your doctor.  Limit your activity. Rest your injured muscle as told by your doctor. Your doctor may say that gentle movements are okay.  If physical therapy was prescribed, do exercises as told by your doctor.  Do not put pressure on any part of the splint until it is fully hardened. This may take many hours.  Do not use any products that contain nicotine or tobacco, such as cigarettes and e-cigarettes. These can delay bone healing. If you need help quitting, ask your doctor.  Warm up before you exercise. This helps to prevent more muscle strains.  Ask your doctor when it is safe to drive if you have a splint.  Keep all follow-up visits as told by your doctor. This is important. Contact a doctor if:  You have more pain or swelling in your injured area. Get help right away if:  You have any of these problems in your injured area: ? You have numbness. ? You have tingling. ? You lose a lot of strength. Summary  A muscle strain is an injury that happens when a muscle is stretched longer than normal.  This condition is first treated with PRICE therapy. This includes protecting, resting, icing, adding pressure, and raising your injury.  Limit your activity. Rest your injured muscle  as told by your doctor. Your doctor may say that gentle movements are okay.  Warm up before you exercise. This helps to prevent more muscle strains. This information is not intended to replace advice given to you by your health care provider. Make sure you discuss any questions you have with your health care provider. Document Revised: 08/30/2018 Document Reviewed: 08/10/2016 Elsevier Patient Education  [place].   Neck Exercises Ask your health care provider which exercises are safe for you. Do exercises exactly as told by your health care provider and adjust them as  directed. It is normal to feel mild stretching, pulling, tightness, or discomfort as you do these exercises. Stop right away if you feel sudden pain or your pain gets worse. Do not begin these exercises until told by your health care provider. Neck exercises can be important for many reasons. They can improve strength and maintain flexibility in your neck, which will help your upper back and prevent neck pain. Stretching exercises Rotation neck stretching  1. Sit in a chair or stand up. 2. Place your feet flat on the floor, shoulder width apart. 3. Slowly turn your head (rotate) to the right until a slight stretch is felt. Turn it all the way to the right so you can look over your right shoulder. Do not tilt or tip your head. 4. Hold this position for 10-30 seconds. 5. Slowly turn your head (rotate) to the left until a slight stretch is felt. Turn it all the way to the left so you can look over your left shoulder. Do not tilt or tip your head. 6. Hold this position for 10-30 seconds. Repeat __________ times. Complete this exercise __________ times a day. Neck retraction 1. Sit in a sturdy chair or stand up. 2. Look straight ahead. Do not bend your neck. 3. Use your fingers to push your chin backward (retraction). Do not bend your neck for this movement. Continue to face straight ahead. If you are doing the exercise properly, you will feel a slight sensation in your throat and a stretch at the back of your neck. 4. Hold the stretch for 1-2 seconds. Repeat __________ times. Complete this exercise __________ times a day. Strengthening exercises Neck press 1. Lie on your back on a firm bed or on the floor with a pillow under your head. 2. Use your neck muscles to push your head down on the pillow and straighten your spine. 3. Hold the position as well as you can. Keep your head facing up (in a neutral position) and your chin tucked. 4. Slowly count to 5 while holding this position. Repeat  __________ times. Complete this exercise __________ times a day. Isometrics These are exercises in which you strengthen the muscles in your neck while keeping your neck still (isometrics). 1. Sit in a supportive chair and place your hand on your forehead. 2. Keep your head and face facing straight ahead. Do not flex or extend your neck while doing isometrics. 3. Push forward with your head and neck while pushing back with your hand. Hold for 10 seconds. 4. Do the sequence again, this time putting your hand against the back of your head. Use your head and neck to push backward against the hand pressure. 5. Finally, do the same exercise on either side of your head, pushing sideways against the pressure of your hand. Repeat __________ times. Complete this exercise __________ times a day. Prone head lifts 1. Lie face-down (prone position), resting on your elbows  so that your chest and upper back are raised. 2. Start with your head facing downward, near your chest. Position your chin either on or near your chest. 3. Slowly lift your head upward. Lift until you are looking straight ahead. Then continue lifting your head as far back as you can comfortably stretch. 4. Hold your head up for 5 seconds. Then slowly lower it to your starting position. Repeat __________ times. Complete this exercise __________ times a day. Supine head lifts 1. Lie on your back (supine position), bending your knees to point to the ceiling and keeping your feet flat on the floor. 2. Lift your head slowly off the floor, raising your chin toward your chest. 3. Hold for 5 seconds. Repeat __________ times. Complete this exercise __________ times a day. Scapular retraction 1. Stand with your arms at your sides. Look straight ahead. 2. Slowly pull both shoulders (scapulae) backward and downward (retraction) until you feel a stretch between your shoulder blades in your upper back. 3. Hold for 10-30 seconds. 4. Relax and  repeat. Repeat __________ times. Complete this exercise __________ times a day. Contact a health care provider if:  Your neck pain or discomfort gets much worse when you do an exercise.  Your neck pain or discomfort does not improve within 2 hours after you exercise. If you have any of these problems, stop exercising right away. Do not do the exercises again unless your health care provider says that you can. Get help right away if:  You develop sudden, severe neck pain. If this happens, stop exercising right away. Do not do the exercises again unless your health care provider says that you can. This information is not intended to replace advice given to you by your health care provider. Make sure you discuss any questions you have with your health care provider. Document Revised: 05/02/2018 Document Reviewed: 05/02/2018 Elsevier Patient Education  [place].

## 2019-12-30 NOTE — Progress Notes (Signed)
° ° °  SUBJECTIVE:   CHIEF COMPLAINT / HPI:  Shoulder and back pain  Seen last week for same pain.  Reports that he had taken Ibuprofen once without relief.  Has been doing some exercises as instructed last week but not consistent.  He reports that he is now have a stretching pain in his back along his Right shoulder blade.  He reports that he is still having some intermittent numbness down his right arm and thumb but is improving.  He has difficulty sleeping at night secondary to pain.  Denies any weakness.  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 C-spine xrays on 06/07 normal exam.    PERTINENT  PMH / PSH:    OBJECTIVE:   BP 110/86    Pulse 79    Ht 5\' 10"  (1.778 m)    Wt 183 lb 6.4 oz (83.2 kg)    SpO2 100%    BMI 26.32 kg/m    General: Alert and oriented, no apparent distress  Neck: nontender MSK: Upper extremity strength 5/5 bilaterally, ROM intact, tenderness to posterior scapular area, no weakness      ASSESSMENT/PLAN:   Cervical radiculopathy -Tylenol CR 1300 mg BID x 14days -Diclofenac gel QID -Flexeril 5mg  qhs x14 days, STOP Zanaflex -Refer to Sports Medicine for evaluation  -Shoulder and neck exercises provided -Follow up as needed     Carollee Leitz, MD [place]

## 2019-12-31 ENCOUNTER — Encounter: Payer: Self-pay | Admitting: Family Medicine

## 2019-12-31 ENCOUNTER — Ambulatory Visit (INDEPENDENT_AMBULATORY_CARE_PROVIDER_SITE_OTHER): Payer: PRIVATE HEALTH INSURANCE | Admitting: Family Medicine

## 2019-12-31 ENCOUNTER — Other Ambulatory Visit: Payer: Self-pay

## 2019-12-31 VITALS — BP 110/86 | HR 79 | Ht 70.0 in | Wt 183.4 lb

## 2019-12-31 DIAGNOSIS — Z0000 Encounter for general adult medical examination without abnormal findings: Secondary | ICD-10-CM | POA: Diagnosis not present

## 2019-12-31 DIAGNOSIS — M5412 Radiculopathy, cervical region: Secondary | ICD-10-CM

## 2019-12-31 MED ORDER — ACETAMINOPHEN ER 650 MG PO TBCR: 1300.0000 mg | EXTENDED_RELEASE_TABLET | Freq: Two times a day (BID) | ORAL | 0 refills | Status: AC

## 2019-12-31 MED ORDER — CYCLOBENZAPRINE HCL 5 MG PO TABS: 5.0000 mg | ORAL_TABLET | Freq: Every day | ORAL | 0 refills | Status: DC

## 2019-12-31 MED ORDER — DICLOFENAC SODIUM 1 % EX GEL: 4.0000 g | Freq: Four times a day (QID) | CUTANEOUS | 1 refills | Status: DC

## 2020-01-01 LAB — HIV ANTIBODY (ROUTINE TESTING W REFLEX): HIV Screen 4th Generation wRfx: NONREACTIVE

## 2020-01-02 ENCOUNTER — Encounter: Payer: Self-pay | Admitting: Family Medicine

## 2020-01-02 NOTE — Assessment & Plan Note (Addendum)
-  Tylenol CR 1300 mg BID x 14days -Diclofenac gel QID -Flexeril 5mg  qhs x14 days, STOP Zanaflex -Refer to Sports Medicine for evaluation  -Shoulder and neck exercises provided -Follow up as needed

## 2020-01-10 ENCOUNTER — Ambulatory Visit (INDEPENDENT_AMBULATORY_CARE_PROVIDER_SITE_OTHER): Payer: PRIVATE HEALTH INSURANCE | Admitting: Family Medicine

## 2020-01-10 ENCOUNTER — Ambulatory Visit: Payer: Self-pay

## 2020-01-10 ENCOUNTER — Other Ambulatory Visit: Payer: Self-pay

## 2020-01-10 VITALS — BP 138/88 | Ht 70.0 in | Wt 185.0 lb

## 2020-01-10 DIAGNOSIS — M25511 Pain in right shoulder: Secondary | ICD-10-CM | POA: Diagnosis not present

## 2020-01-10 DIAGNOSIS — M75111 Incomplete rotator cuff tear or rupture of right shoulder, not specified as traumatic: Secondary | ICD-10-CM

## 2020-01-10 MED ORDER — NITROGLYCERIN 0.2 MG/HR TD PT24: MEDICATED_PATCH | TRANSDERMAL | 2 refills | Status: DC

## 2020-01-10 MED ORDER — METHYLPREDNISOLONE ACETATE 40 MG/ML IJ SUSP
40.0000 mg | Freq: Once | INTRAMUSCULAR | Status: AC
  Administered 2020-01-10: 40 mg via INTRA_ARTICULAR

## 2020-01-10 NOTE — Patient Instructions (Signed)
We did an ultrasound of your shoulder which shows an intrasubstance tear of one of the rotator cuff muscles.  The muscle is not torn all the way through so you do not need surgery.  This is a very aggravating injury though and can take 2 to 3 months to heal.  We are giving you some exercises to do.  How quickly you heal will depend on how diligent you are with the exercises to some extent.  We also gave you a corticosteroid injection today which should help some with the pain over the next few weeks.  You can also use over-the-counter medications and rubs for pain relief.  We also decided today after our discussion to try nitroglycerin patch to see if we can speed up this healing.  I have put the instructions below.  We discussed today in clinic but if you have any questions, please do not hesitate to call our office.  I would like to see you back in 4 to 5 weeks.  Certainly if you have any questions or problems in the meantime, give [place] a call.  I think it is fine for you to go ahead and do your current work.  I do not think that is going to further injure your rotator cuff however it will continue to be painful as it has for the last 2 months.  Healing of this type of injury is gradual in size said above it will take 2 to 3 months probably before you are totally healed.  Cut patch into one - fourth pieces Place a one fourth piece of patch on  skin over affected area, changing to a new piece every 24 hours.   You may experience a headache during the first 1-2 days and maybe up to 2 weeks of using the patch; these should improve and go away. If you experience headaches after beginning nitroglycerin patch treatment, you may take your preferred over the counter pain medicine such as tylenol or advil as directed on the box. Another side effect of the nitroglycerin patch can be skin irritation  or rash related to the patch adhesive.   Please notify our office if you develop  severe headaches or rash, and  stop the patch.  Please call our office with any questions or problems. Tendon healing with nitroglycerin patch may require 12 to 24 weeks depending on the extent of injury. Men should not use if taking Viagra, Cialis, or Levitra as it may cause an unsafe lowering of the blood pressure..  Use with caution if you have migraines or rosacea.  It was very nice to meet you and I will see you back in 4 to 6 weeks.

## 2020-01-10 NOTE — Progress Notes (Signed)
    Matthew Perry

## 2020-01-11 DIAGNOSIS — M75111 Incomplete rotator cuff tear or rupture of right shoulder, not specified as traumatic: Secondary | ICD-10-CM | POA: Insufficient documentation

## 2020-01-11 NOTE — Progress Notes (Signed)
  Matthew Perry - 49 y.o. male MRN [id number]  Date of birth: [birth year]    SUBJECTIVE:      Chief complaint Right shoulder and neck pain.  Primarily in the shoulder.  Worse with certain motions such as forward flexion or lateral abduction.  Is been going on since about April or May.  Just woke up one morning and noticed it was sore and then it has continued to get worse.  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  Has some pain while he is working but does not impede his activities.  He occasionally has pain shooting down his right arm but never up into his neck.  He has no numbness or tingling and no loss of strength in the right upper extremity.  Never had problems with this before.  No specific inciting injury.  Has had no shoulder surgeries. Current daily smoker.  No history of diabetes mellitus.   OBJECTIVE: BP 138/88   Ht 5\' 10"  (1.778 m)   Wt 185 lb (83.9 kg)   BMI 26.54 kg/m   Physical Exam:  Vital signs are reviewed. GENERAL: Well-developed male no acute distress NECK: Full range of motion.  Nontender cervical vertebra to palpation and percussion.  He has full range of motion that is painless.  Spurling's is negative bilaterally. MSK: Bilateral upper extremity have 5 out of 5 strength in C4-5 and 6 SHOULDER: Right.  Mildly tender palpation over the proximal bicep tendon but this does not totally reproduce his pain.  He has some pain but full range of motion in lateral abduction and forward flexion above shoulder height.  Distally he is neurovascularly intact bilaterally. ULTRASOUND: Biceps tendon is intact without any sign of defect.  Small amount of fluid in the bicipital tendon sheath.  Supraspinatus tendon reveals 2 midsubstance tears with some calcifications.  There is slight increased Doppler activity in this area.  The subscapularis, teres minor and infraspinatus tendons are seen well without any significant defect.  PROCEDURE: INJECTION: Patient was given informed  consent, signed copy in the chart. Appropriate time out was taken. Area prepped and draped in usual sterile fashion. Ethyl chloride was  used for local anesthesia. A 21 gauge 1 1/2 inch needle was used.. 1 cc of methylprednisolone 40 mg/ml plus 4 cc of 1% lidocaine without epinephrine was injected into the subacromial bursa using a(n) posterior approach.   The patient tolerated the procedure well. There were no complications. Post procedure instructions were given.   ASSESSMENT & PLAN:  See problem based charting & AVS for pt instructions. No problem-specific Assessment & Plan notes found for this encounter.

## 2020-01-11 NOTE — Assessment & Plan Note (Signed)
Discussed options.  Do not think he needs surgery.  He has mostly intact strength which makes me think this is more of an irritation rather than an acute new tear.  He does not use steroids, he is not diabetic and he had no known inciting injury.  He is a smoker. Home exercise program, nitroglycerin patch.  We also gave him a corticosteroid injection in the subacromial bursa today to see if we can give him some relief so he contact the home exercise program.  No work restrictions.  Follow-up 2 to 4 weeks.  Call in the interim with new or worsening symptoms.

## 2020-02-18 ENCOUNTER — Other Ambulatory Visit: Payer: Self-pay

## 2020-02-18 ENCOUNTER — Ambulatory Visit (AMBULATORY_SURGERY_CENTER): Payer: Self-pay | Admitting: *Deleted

## 2020-02-18 ENCOUNTER — Encounter: Payer: Self-pay | Admitting: Gastroenterology

## 2020-02-18 VITALS — Ht 70.5 in | Wt 187.6 lb

## 2020-02-18 DIAGNOSIS — Z01818 Encounter for other preprocedural examination: Secondary | ICD-10-CM

## 2020-02-18 DIAGNOSIS — Z1211 Encounter for screening for malignant neoplasm of colon: Secondary | ICD-10-CM

## 2020-02-18 MED ORDER — SUTAB 1479-225-188 MG PO TABS: 1.0000 | ORAL_TABLET | ORAL | 0 refills | Status: DC

## 2020-02-18 NOTE — Progress Notes (Signed)
Patient denies any allergies to egg or soy products. Patient denies complications with anesthesia/sedation.  Patient has never had any anesthesia, no prior surgery history.  Emmi instructions for colonoscopy explained and given to patient.  Covid test scheduled on 02/27/20 at 3:20 pm.

## 2020-02-27 ENCOUNTER — Ambulatory Visit (INDEPENDENT_AMBULATORY_CARE_PROVIDER_SITE_OTHER): Payer: PRIVATE HEALTH INSURANCE

## 2020-02-27 ENCOUNTER — Other Ambulatory Visit: Payer: Self-pay | Admitting: Gastroenterology

## 2020-02-27 DIAGNOSIS — Z1159 Encounter for screening for other viral diseases: Secondary | ICD-10-CM

## 2020-02-27 LAB — SARS CORONAVIRUS 2 (TAT 6-24 HRS): SARS Coronavirus 2: NEGATIVE

## 2020-03-02 ENCOUNTER — Other Ambulatory Visit: Payer: Self-pay

## 2020-03-02 ENCOUNTER — Encounter: Payer: Self-pay | Admitting: Gastroenterology

## 2020-03-02 ENCOUNTER — Ambulatory Visit (AMBULATORY_SURGERY_CENTER): Payer: PRIVATE HEALTH INSURANCE | Admitting: Gastroenterology

## 2020-03-02 VITALS — BP 144/93 | HR 52 | Temp 97.7°F | Resp 21 | Ht 70.0 in | Wt 187.6 lb

## 2020-03-02 DIAGNOSIS — K635 Polyp of colon: Secondary | ICD-10-CM

## 2020-03-02 DIAGNOSIS — K621 Rectal polyp: Secondary | ICD-10-CM | POA: Diagnosis not present

## 2020-03-02 DIAGNOSIS — Z1211 Encounter for screening for malignant neoplasm of colon: Secondary | ICD-10-CM

## 2020-03-02 DIAGNOSIS — D128 Benign neoplasm of rectum: Secondary | ICD-10-CM

## 2020-03-02 DIAGNOSIS — D125 Benign neoplasm of sigmoid colon: Secondary | ICD-10-CM

## 2020-03-02 MED ORDER — SODIUM CHLORIDE 0.9 % IV SOLN: 500.0000 mL | Freq: Once | INTRAVENOUS | Status: DC

## 2020-03-02 NOTE — Progress Notes (Signed)
Called to room to assist during endoscopic procedure.  Patient ID and intended procedure confirmed with present staff. Received instructions for my participation in the procedure from the performing physician.  

## 2020-03-02 NOTE — Progress Notes (Signed)
Report to PACU, RN, vss, BBS= Clear.  

## 2020-03-02 NOTE — Progress Notes (Signed)
VS-CW  Pt's states no medical or surgical changes since previsit or office visit.  

## 2020-03-02 NOTE — Op Note (Signed)
[facility] Patient Name: Matthew Perry Procedure Date: 03/02/2020 8:24 AM MRN: [id number] Endoscopist: Mauri Pole , MD Age: 49 Referring MD:  Date of Birth: 1970-10-17 Gender: Male Account #: [id number] Procedure:                Colonoscopy Indications:              Screening for colorectal malignant neoplasm Medicines:                Monitored Anesthesia Care Procedure:                Pre-Anesthesia Assessment:                           - Prior to the procedure, a History and Physical                            was performed, and patient medications and                            allergies were reviewed. The patient's tolerance of                            previous anesthesia was also reviewed. The risks                            and benefits of the procedure and the sedation                            options and risks were discussed with the patient.                            All questions were answered, and informed consent                            was obtained. Prior Anticoagulants: The patient has                            taken no previous anticoagulant or antiplatelet                            agents. ASA Grade Assessment: II - A patient with                            mild systemic disease. After reviewing the risks                            and benefits, the patient was deemed in                            satisfactory condition to undergo the procedure.                           After obtaining informed consent, the colonoscope  was passed under direct vision. Throughout the                            procedure, the patient's blood pressure, pulse, and                            oxygen saturations were monitored continuously. The                            Colonoscope was introduced through the anus and                            advanced to the the cecum, identified by                            appendiceal orifice and  ileocecal valve. The                            colonoscopy was performed without difficulty. The                            patient tolerated the procedure well. The quality                            of the bowel preparation was good. The ileocecal                            valve, appendiceal orifice, and rectum were                            photographed. Scope In: 8:33:21 AM Scope Out: 8:45:01 AM Scope Withdrawal Time: 0 hours 8 minutes 56 seconds  Total Procedure Duration: 0 hours 11 minutes 40 seconds  Findings:                 The perianal and digital rectal examinations were                            normal.                           Five sessile polyps were found in the rectum and                            sigmoid colon. The polyps were 1 to 3 mm in size.                            These polyps were removed with a cold biopsy                            forceps. Resection and retrieval were complete.                           Non-bleeding internal hemorrhoids were found during  retroflexion. The hemorrhoids were small.                           The exam was otherwise without abnormality. Complications:            No immediate complications. Estimated Blood Loss:     Estimated blood loss was minimal. Impression:               - Five 1 to 3 mm polyps in the rectum and in the                            sigmoid colon, removed with a cold biopsy forceps.                            Resected and retrieved.                           - Non-bleeding internal hemorrhoids.                           - The examination was otherwise normal. Recommendation:           - Patient has a contact number available for                            emergencies. The signs and symptoms of potential                            delayed complications were discussed with the                            patient. Return to normal activities tomorrow.                            Written  discharge instructions were provided to the                            patient.                           - Resume previous diet.                           - Continue present medications.                           - Await pathology results.                           - Repeat colonoscopy in 5-10 years for surveillance                            based on pathology results. Mauri Pole, MD 03/02/2020 8:49:51 AM This report has been signed electronically.

## 2020-03-02 NOTE — Patient Instructions (Signed)
HANDOUTS PROVIDED ON: POLYPS & HEMORRHOIDS ° °The polyps removed today have been sent for pathology.  The results can take 1-3 weeks to receive.  When your next colonoscopy should occur will be based on the pathology results.   ° °You may resume your previous diet and medication schedule. ° °Thank you for allowing us to care for you today!!! ° ° °YOU HAD AN ENDOSCOPIC PROCEDURE TODAY AT THE [facility]:   Refer to the procedure report that was given to you for any specific questions about what was found during the examination.  If the procedure report does not answer your questions, please call your gastroenterologist to clarify.  If you requested that your care partner not be given the details of your procedure findings, then the procedure report has been included in a sealed envelope for you to review at your convenience later. ° °YOU SHOULD EXPECT: Some feelings of bloating in the abdomen. Passage of more gas than usual.  Walking can help get rid of the air that was put into your GI tract during the procedure and reduce the bloating. If you had a lower endoscopy (such as a colonoscopy or flexible sigmoidoscopy) you may notice spotting of blood in your stool or on the toilet paper. If you underwent a bowel prep for your procedure, you may not have a normal bowel movement for a few days. ° °Please Note:  You might notice some irritation and congestion in your nose or some drainage.  This is from the oxygen used during your procedure.  There is no need for concern and it should clear up in a day or so. ° °SYMPTOMS TO REPORT IMMEDIATELY: ° °· Following lower endoscopy (colonoscopy or flexible sigmoidoscopy): ° Excessive amounts of blood in the stool ° Significant tenderness or worsening of abdominal pains ° Swelling of the abdomen that is new, acute ° Fever of 100°F or higher ° °For urgent or emergent issues, a gastroenterologist can be reached at any hour by calling [phone]. °Do not use MyChart  messaging for urgent concerns.  ° ° °DIET:  We do recommend a small meal at first, but then you may proceed to your regular diet.  Drink plenty of fluids but you should avoid alcoholic beverages for 24 hours. ° °ACTIVITY:  You should plan to take it easy for the rest of today and you should NOT DRIVE or use heavy machinery until tomorrow (because of the sedation medicines used during the test).   ° °FOLLOW UP: °Our staff will call the number listed on your records 48-72 hours following your procedure to check on you and address any questions or concerns that you may have regarding the information given to you following your procedure. If we do not reach you, we will leave a message.  We will attempt to reach you two times.  During this call, we will ask if you have developed any symptoms of COVID 19. If you develop any symptoms (ie: fever, flu-like symptoms, shortness of breath, cough etc.) before then, please call [phone].  If you test positive for Covid 19 in the 2 weeks post procedure, please call and report this information to us.   ° °If any biopsies were taken you will be contacted by phone or by letter within the next 1-3 weeks.  Please call us at [phone] if you have not heard about the biopsies in 3 weeks.  ° ° °SIGNATURES/CONFIDENTIALITY: °You and/or your care partner have signed paperwork which will be   entered into your electronic medical record.  These signatures attest to the fact that that the information above on your After Visit Summary has been reviewed and is understood.  Full responsibility of the confidentiality of this discharge information lies with you and/or your care-partner. 

## 2020-03-04 ENCOUNTER — Telehealth: Payer: Self-pay | Admitting: *Deleted

## 2020-03-04 NOTE — Telephone Encounter (Signed)
  Follow up Call-  Call back number 03/02/2020  Post procedure Call Back phone  # [phone]  Permission to leave phone message Yes  Some recent data might be hidden     Patient questions:  Do you have a fever, pain , or abdominal swelling? No. Pain Score  0 *  Have you tolerated food without any problems? Yes.    Have you been able to return to your normal activities? Yes.    Do you have any questions about your discharge instructions: Diet   No. Medications  No. Follow up visit  No.  Do you have questions or concerns about your Care? No.  Actions: * If pain score is 4 or above: 1. No action needed, pain <4.Have you developed a fever since your procedure? no  2.   Have you had an respiratory symptoms (SOB or cough) since your procedure? no  3.   Have you tested positive for COVID 19 since your procedure no  4.   Have you had any family members/close contacts diagnosed with the COVID 19 since your procedure?  no   If yes to any of these questions please route to Joylene John, RN and Joella Prince, RN

## 2020-03-10 ENCOUNTER — Encounter: Payer: Self-pay | Admitting: Gastroenterology

## 2020-04-16 ENCOUNTER — Encounter: Payer: Self-pay | Admitting: Family Medicine

## 2020-04-16 ENCOUNTER — Ambulatory Visit (INDEPENDENT_AMBULATORY_CARE_PROVIDER_SITE_OTHER): Payer: PRIVATE HEALTH INSURANCE | Admitting: Family Medicine

## 2020-04-16 ENCOUNTER — Other Ambulatory Visit: Payer: Self-pay

## 2020-04-16 VITALS — BP 132/80 | HR 83 | Ht 70.0 in | Wt 184.4 lb

## 2020-04-16 DIAGNOSIS — M75111 Incomplete rotator cuff tear or rupture of right shoulder, not specified as traumatic: Secondary | ICD-10-CM

## 2020-04-16 DIAGNOSIS — Z23 Encounter for immunization: Secondary | ICD-10-CM | POA: Insufficient documentation

## 2020-04-16 DIAGNOSIS — M25522 Pain in left elbow: Secondary | ICD-10-CM | POA: Diagnosis not present

## 2020-04-16 NOTE — Assessment & Plan Note (Signed)
Medial epicondyle pain most likely associated with wrist flexor strain and/or tendinopathy.  Patient compensating with left arm as he is avoiding use of his right arm due to rotator cuff tear. -Recommended Tylenol and ibuprofen 1 tablet each together 3-4 times daily -Cool compresses -Salonpas or other topical medications (such as diclofenac) to area to help reduce pain -Recommended follow-up with sports medicine

## 2020-04-16 NOTE — Patient Instructions (Signed)
Thank you for coming in to see [place] today! Please see below to review our plan for today's visit:  1. I recommend Tylenol 1 tablet of 500mg  with Ibuprofen 200mg  every 6-8 hours (meaning 3-4 times daily) for the next 3 days.  2. Apply cold compress to your R shoulder and left elbow before and after work.  3. Stay hydrated with water.  4. Consider SalonPas patches to shoulder.  5. Get in touch with Sports med for follow up.   Please call the clinic at [phone] if your symptoms worsen or you have any concerns. It was our pleasure to serve you!   Dr. Milus Banister [facility]

## 2020-04-16 NOTE — Assessment & Plan Note (Signed)
Patient with continued pain, has not been taking any medications over-the-counter to help reduce his pain.  Range of motion and strength are full and symmetrical bilaterally, no concern at this time for adhesive capsulitis. -Recommended Tylenol and ibuprofen 1 tablet each together 3-4 times daily to reduce pain, inflammation. -Also recommended patient using Salonpas or other topical patch or gel to reduce pain to the shoulder -Cold compresses -Encourage patient to continue his home exercise program, nitroglycerin patches to shoulder -Encouraged him to follow-up with sports medicine as needed for further imaging, management since he was previously seen by them in June.

## 2020-04-16 NOTE — Progress Notes (Signed)
SUBJECTIVE:   CHIEF COMPLAINT / HPI:   Right shoulder Pain: This is a pleasant 49 year old male patient reporting to clinic with concerns for continued right shoulder pain.  He was previously evaluated for this at the sports medicine center in June 2021 for which he received a corticosteroid injection to the shoulder.  The patient reports he is not currently taking anything to help control his pain.  He reports the pain is worse at the end of a long day.  He works a very physically demanding job.  Denies any injuries, falls.  Denies any sensation changes or numbness to the extremity.  He is right-hand dominant.  Left elbow pain: The patient reports medial left elbow pain anterior to the medial epicondyle.  He reports the pain started recently since he has been trying to use his right arm last due to his right shoulder pain and has been compensating with his left, weaker arm.  Denies any falls, traumas.  Denies any weakness, bleeding.  Was presenting to have this checked out, asked what he can do for pain control.  PERTINENT  PMH / PSH:  Patient Active Problem List   Diagnosis Date Noted  . Left elbow pain 04/16/2020  . Need for immunization against influenza 04/16/2020  . Nontraumatic incomplete tear of right rotator cuff 01/11/2020    OBJECTIVE:   BP 132/80   Pulse 83   Ht 5\' 10"  (1.778 m)   Wt 184 lb 6 oz (83.6 kg)   SpO2 98%   BMI 26.46 kg/m    Physical exam: General: Pleasant patient, no apparent distress Respiratory: Speaking complete sentences, comfortable work of breathing Right shoulder:  Inspection: no obvious deformity, atrophy, or asymmetry. No bruising. No swelling Palpation: no TTP over AC joint, moderate tenderness to bicipital groove. ROM: Full in flexion, abduction, internal/external rotation although slow to obtain these position secondary to pain NV intact distally Normal scapular function observed. Special Tests:  - Impingement: Pain with Hawkins, negative  empty can sign. - Supraspinatous: Negative empty can.  5/5 strength with resisted flexion at 20 degrees - Infraspinatous/Teres Minor: 5/5 strength with ER - Subscapularis: 5/5 strength with IR - Biceps tendon: Negative Yerrgason's  - AC Joint: Patient cannot perform cross arm test - No painful arc and no drop arm sign  Left elbow:  - Inspection: no gross deformity. No swelling/effusion, erythema or bruising. Skin intact - Palpation: no TTP - ROM: full active ROM with flexion and extension - Strength: 5/5 strength - Neuro/vasc: NV intact   ASSESSMENT/PLAN:   Nontraumatic incomplete tear of right rotator cuff Patient with continued pain, has not been taking any medications over-the-counter to help reduce his pain.  Range of motion and strength are full and symmetrical bilaterally, no concern at this time for adhesive capsulitis. -Recommended Tylenol and ibuprofen 1 tablet each together 3-4 times daily to reduce pain, inflammation. -Also recommended patient using Salonpas or other topical patch or gel to reduce pain to the shoulder -Cold compresses -Encourage patient to continue his home exercise program, nitroglycerin patches to shoulder -Encouraged him to follow-up with sports medicine as needed for further imaging, management since he was previously seen by them in June.  Left elbow pain Medial epicondyle pain most likely associated with wrist flexor strain and/or tendinopathy.  Patient compensating with left arm as he is avoiding use of his right arm due to rotator cuff tear. -Recommended Tylenol and ibuprofen 1 tablet each together 3-4 times daily -Cool compresses -Salonpas or other  topical medications (such as diclofenac) to area to help reduce pain -Recommended follow-up with sports medicine  Need for immunization against influenza -Patient received his flu vaccination today     Daisy Floro, [place]

## 2020-04-16 NOTE — Assessment & Plan Note (Signed)
-  Patient received his flu vaccination today

## 2020-04-30 ENCOUNTER — Ambulatory Visit: Payer: PRIVATE HEALTH INSURANCE | Admitting: Family Medicine

## 2020-11-23 ENCOUNTER — Other Ambulatory Visit: Payer: Self-pay

## 2020-11-23 ENCOUNTER — Ambulatory Visit (INDEPENDENT_AMBULATORY_CARE_PROVIDER_SITE_OTHER): Payer: PRIVATE HEALTH INSURANCE

## 2020-11-23 ENCOUNTER — Ambulatory Visit (HOSPITAL_COMMUNITY)
Admission: EM | Admit: 2020-11-23 | Discharge: 2020-11-23 | Disposition: A | Discharge status: Home / Self Care | Payer: PRIVATE HEALTH INSURANCE | Attending: Emergency Medicine | Admitting: Emergency Medicine

## 2020-11-23 ENCOUNTER — Encounter (HOSPITAL_COMMUNITY): Payer: Self-pay

## 2020-11-23 DIAGNOSIS — S62339A Displaced fracture of neck of unspecified metacarpal bone, initial encounter for closed fracture: Secondary | ICD-10-CM

## 2020-11-23 DIAGNOSIS — S62397A Other fracture of fifth metacarpal bone, left hand, initial encounter for closed fracture: Secondary | ICD-10-CM

## 2020-11-23 DIAGNOSIS — R2232 Localized swelling, mass and lump, left upper limb: Secondary | ICD-10-CM

## 2020-11-23 DIAGNOSIS — S6992XA Unspecified injury of left wrist, hand and finger(s), initial encounter: Secondary | ICD-10-CM

## 2020-11-23 MED ORDER — KETOROLAC TROMETHAMINE 30 MG/ML IJ SOLN
INTRAMUSCULAR | Status: AC
  Filled 2020-11-23: qty 1

## 2020-11-23 MED ORDER — IBUPROFEN 800 MG PO TABS
800.0000 mg | ORAL_TABLET | Freq: Three times a day (TID) | ORAL | 0 refills | Status: DC
Start: 2020-11-23 — End: 2021-01-12

## 2020-11-23 MED ORDER — KETOROLAC TROMETHAMINE 30 MG/ML IJ SOLN
30.0000 mg | Freq: Once | INTRAMUSCULAR | Status: AC
  Administered 2020-11-23: 30 mg via INTRAMUSCULAR

## 2020-11-23 NOTE — Discharge Instructions (Signed)
Follow up with Dr. Claudia Desanctis this week.    Take the Ibuprofen as needed for pain.  You can also take Tylenol.

## 2020-11-23 NOTE — ED Notes (Signed)
Called ortho tech regarding splint. State will come shortly.

## 2020-11-23 NOTE — ED Triage Notes (Signed)
Pt presents with swelling to the left hand x last night. Pt states he injured his hand fighting.

## 2020-11-23 NOTE — Progress Notes (Signed)
Orthopedic Tech Progress Note Patient Details:  Matthew Perry February 20, 1971 [id number] I applied and NIDONNA wrapped Ortho Devices Type of Ortho Device: Ulna gutter splint Ortho Device/Splint Location: LUE Ortho Device/Splint Interventions: Ordered,Application   Post Interventions Patient Tolerated: Well Instructions Provided: Care of [name] 11/23/2020, 4:27 PM
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
## 2020-11-26 ENCOUNTER — Ambulatory Visit (INDEPENDENT_AMBULATORY_CARE_PROVIDER_SITE_OTHER): Payer: PRIVATE HEALTH INSURANCE | Admitting: Plastic Surgery

## 2020-11-26 ENCOUNTER — Encounter: Payer: Self-pay | Admitting: Plastic Surgery

## 2020-11-26 ENCOUNTER — Other Ambulatory Visit: Payer: Self-pay

## 2020-11-26 VITALS — BP 148/83 | HR 78 | Ht 69.0 in | Wt 185.0 lb

## 2020-11-26 DIAGNOSIS — S62337A Displaced fracture of neck of fifth metacarpal bone, left hand, initial encounter for closed fracture: Secondary | ICD-10-CM

## 2020-11-26 MED ORDER — HYDROCODONE-ACETAMINOPHEN 5-325 MG PO TABS: 1.0000 | ORAL_TABLET | Freq: Four times a day (QID) | ORAL | 0 refills | Status: DC | PRN
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
## 2020-12-03 ENCOUNTER — Ambulatory Visit: Payer: PRIVATE HEALTH INSURANCE | Attending: Plastic Surgery | Admitting: Occupational Therapy

## 2020-12-03 ENCOUNTER — Other Ambulatory Visit: Payer: Self-pay

## 2020-12-03 ENCOUNTER — Encounter: Payer: Self-pay | Admitting: Occupational Therapy

## 2020-12-03 DIAGNOSIS — M25642 Stiffness of left hand, not elsewhere classified: Secondary | ICD-10-CM | POA: Diagnosis present

## 2020-12-03 DIAGNOSIS — M6281 Muscle weakness (generalized): Secondary | ICD-10-CM | POA: Insufficient documentation

## 2020-12-03 DIAGNOSIS — M79642 Pain in left hand: Secondary | ICD-10-CM | POA: Diagnosis present

## 2020-12-03 NOTE — Patient Instructions (Signed)
WEARING SCHEDULE:  Wear splint at ALL times except for hygiene care( remove splint to clean hand then put it back on immediately. Make sure hand is really dry) PURPOSE:  To prevent movement and for protection until injury can heal  CARE OF SPLINT:  Keep splint away from heat sources including: stove, radiator or furnace, or a car in sunlight. The splint can melt and will no longer fit you properly  Keep away from pets and children  Clean the splint with rubbing alcohol 1-2 times per day.  * During this time, make sure you also clean your hand/arm as instructed by your therapist and/or perform dressing changes as needed. Then dry hand/arm completely before replacing splint. (When cleaning hand/arm, keep it immobilized in same position until splint is replaced)  PRECAUTIONS/POTENTIAL PROBLEMS: *If you notice or experience increased pain, swelling, numbness, or a lingering reddened area from the splint: Contact your therapist immediately by calling [phone]. You must wear the splint for protection, but we will get you scheduled for adjustments as quickly as possible.  (If only straps or hooks need to be replaced and NO adjustments to the splint need to be made, just call the office ahead and let them know you are coming in)  If you have any medical concerns or signs of infection, please call your doctor immediately
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
## 2020-12-08 ENCOUNTER — Other Ambulatory Visit: Payer: Self-pay

## 2020-12-08 ENCOUNTER — Ambulatory Visit: Payer: PRIVATE HEALTH INSURANCE | Admitting: Occupational Therapy

## 2020-12-08 DIAGNOSIS — M79642 Pain in left hand: Secondary | ICD-10-CM | POA: Diagnosis not present

## 2020-12-08 DIAGNOSIS — M25642 Stiffness of left hand, not elsewhere classified: Secondary | ICD-10-CM

## 2020-12-08 NOTE — Therapy (Signed)
[facility] [address] [place], [place], [address] Phone: [phone]   Fax:  [phone]  Occupational Therapy Treatment  Patient Details  Name: Matthew Perry MRN: [id number] Date of Birth: 1970/11/21 Referring Provider (OT): Dr. Claudia Desanctis   Encounter Date: 12/08/2020   OT End of Session - 12/08/20 0813    Visit Number 2    Number of Visits 13    Date for OT Re-Evaluation 03/04/21    Authorization Type [employer]    OT Start Time 0755    OT Stop Time 0810    OT Time Calculation (min) 15 min    Activity Tolerance Patient tolerated treatment well           Past Medical History:  Diagnosis Date  . GERD (gastroesophageal reflux disease)     No past surgical history on file.  There were no vitals filed for this visit.   Subjective Assessment - 12/08/20 0812    Subjective  My straps are all falling off    Patient Stated Goals splint    Currently in Pain? --   not assessed, but did not appear to be in pain   Pain Onset 1 to 4 weeks ago            Pt returns today for splint adjustments. Pt reports cutting down splint with a razor to allow PIP movement at ring finger. Pt still protected at MP joints of 4th and 5th digits, but instructed pt to not make adjustments on his own going forward and allow the therapist to make adjustments prn. Pt's straps and hooks were already falling off from last week, therefore replaced all hooks and straps and provided extra hook velcro and extra straps. Pt instructed to wear one set of straps at work and one set at home.                       OT Short Term Goals - 12/08/20 0813      OT SHORT TERM GOAL #1   Title I with splint wear care and precautions    Time 6    Period Weeks    Status Achieved      OT SHORT TERM GOAL #2   Title I with inital HEP once cleared for ROM.    Time 6    Period Weeks    Status New             OT Long Term Goals - 12/03/20  1603      OT LONG TERM GOAL #1   Title I with updated HEP.    Time 12    Period Weeks    Status New      OT LONG TERM GOAL #2   Title Pt will demonstrate at least 90% composite finger flexion for LUE for increased functional use.    Time 12    Period Weeks    Status New      OT LONG TERM GOAL #3   Title Pt will demonstrate LUE grip strength of at least 40 lbs for work activities.    Time 12    Period Weeks    Status New      OT LONG TERM GOAL #4   Title Pt will report pain in L hand no greater than 3/10 with ADLs/IADLs.    Time 12    Period Weeks    Status New      OT LONG TERM GOAL #  5   Title Pt will resume use of LUE as a non dominant asssit at least 80% of the time for ADLs/IADLs.    Time 12    Period Weeks    Status New                 Plan - 12/08/20 0813    Clinical Impression Statement Pt comes today for splint adjustments. Pt still requires immobolization therefore cannot progress further at this time    OT Occupational Profile and History Problem Focused Assessment - Including review of records relating to presenting problem    Occupational performance deficits (Please refer to evaluation for details): ADL's;IADL's;Work;Leisure;Social Participation    Body Structure / Function / Physical Skills ADL;Mobility;Strength;Flexibility;FMC;Pain;Coordination;Decreased knowledge of precautions;Decreased knowledge of use of DME;GMC;ROM;Dexterity;IADL;Sensation    Rehab Potential Good    Clinical Decision Making Limited treatment options, no task modification necessary    Comorbidities Affecting Occupational Performance: May have comorbidities impacting occupational performance    Modification or Assistance to Complete Evaluation  No modification of tasks or assist necessary to complete eval    OT Frequency 1x / week   plus eval   OT Duration 12 weeks    OT Treatment/Interventions Self-care/ADL training;Therapeutic exercise;Splinting;Manual  Therapy;Ultrasound;Neuromuscular education;Therapeutic activities;Paraffin;Cryotherapy;DME and/or AE instruction;Fluidtherapy;Electrical Stimulation;Moist Heat;Contrast Bath;Passive range of motion;Patient/family education    Plan progress when able    Consulted and Agree with Plan of Care Patient           Patient will benefit from skilled therapeutic intervention in order to improve the following deficits and impairments:   Body Structure / Function / Physical Skills: ADL,Mobility,Strength,Flexibility,FMC,Pain,Coordination,Decreased knowledge of precautions,Decreased knowledge of use of DME,GMC,ROM,Dexterity,IADL,Sensation       Visit Diagnosis: Stiffness of left hand, not elsewhere classified    Problem List Patient Active Problem List   Diagnosis Date Noted  . Left elbow pain 04/16/2020  . Need for immunization against influenza 04/16/2020  . Nontraumatic incomplete tear of right rotator cuff 01/11/2020    Carey Bullocks, OTR/L 12/08/2020, 8:15 AM  [facility] [address] [place], [place], [address] Phone: [phone]   Fax:  [phone]  Name: Matthew Perry MRN: [id number] Date of Birth: 11/13/1970

## 2020-12-23 ENCOUNTER — Encounter (HOSPITAL_COMMUNITY): Payer: Self-pay

## 2020-12-23 ENCOUNTER — Ambulatory Visit (INDEPENDENT_AMBULATORY_CARE_PROVIDER_SITE_OTHER): Payer: PRIVATE HEALTH INSURANCE

## 2020-12-23 ENCOUNTER — Other Ambulatory Visit: Payer: Self-pay

## 2020-12-23 ENCOUNTER — Ambulatory Visit (HOSPITAL_COMMUNITY)
Admission: EM | Admit: 2020-12-23 | Discharge: 2020-12-23 | Disposition: A | Discharge status: Home / Self Care | Payer: PRIVATE HEALTH INSURANCE | Attending: Family Medicine | Admitting: Family Medicine

## 2020-12-23 DIAGNOSIS — R1010 Upper abdominal pain, unspecified: Secondary | ICD-10-CM

## 2020-12-23 NOTE — ED Triage Notes (Signed)
Patient states that he has had stomach cramping and pain for the past 4 days.

## 2020-12-24 NOTE — ED Provider Notes (Signed)
[facility]   [id number] 12/23/20 Arrival Time: 9678  ASSESSMENT & PLAN:  1. Pain of upper abdomen    Unclear etiology but appears to be in significant pain. Is not comfortable with conservative GI home tx. Prefers ED eval. To ED via private vehicle. Stable upon discharge. I have personally viewed the imaging studies ordered this visit. No sign of SBO on abd films.  Discharge Instructions   None     Follow-up Information     Go to  [facility].   Specialty: Emergency Medicine Contact information: [address] [id number] [place] [phone]                Reviewed expectations re: course of current medical issues. Questions answered. Outlined signs and symptoms indicating need for more acute intervention. Patient verbalized understanding. After Visit Summary given.   SUBJECTIVE: History from: patient. Matthew Perry is a 50 y.o. male who presents with complaint of persistent upper abdominal discomfort. Onset gradual,  past 4 d . Discomfort described as aching; without radiation; does not wake him at night. Reports normal flatus. Symptoms are gradually worsening since beginning. Fever: absent. Aggravating factors: have not been identified. Alleviating factors: have not been identified. Associated symptoms: mild nausea without emesic. He denies fever. Appetite: normal. PO intake: normal. Ambulatory without assistance. Urinary symptoms: none. Bowel movements: have not significantly changed. History of similar: no. OTC treatment: none PTA.  History reviewed. No pertinent surgical history.   OBJECTIVE:  Vitals:   12/23/20 1527 12/23/20 1530  BP:  129/81  Pulse:  63  Resp:  20  Temp:  98.3 F (36.8 C)  TempSrc:  Oral  SpO2:  100%  Weight: 83.9 kg   Height: 5\' 9"  (1.753 m)     General appearance: alert, oriented, no acute distress but appears uncomfortable HEENT: [place]; AT;  oropharynx moist Lungs: unlabored respirations Abdomen: soft; without distention; moderate and poorly localized tenderness to palpation over upper abdomen ; normal bowel sounds; without masses or organomegaly; without guarding or rebound tenderness Back: without reported CVA tenderness; FROM at waist Extremities: without LE edema; symmetrical; without gross deformities Skin: warm and dry Neurologic: normal gait Psychological: alert and cooperative; normal mood and affect   Imaging: DG Abd 2 Views  Result Date: 12/23/2020 CLINICAL DATA:  Upper abdominal pain and cramping. EXAM: ABDOMEN - 2 VIEW COMPARISON:  None. FINDINGS: Gas is seen in nondilated small bowel and colon. Scattered colonic stool. No unexpected radiopaque calculi. Visualized lung bases are clear. IMPRESSION: No acute findings. Electronically Signed   By: Lorin Picket M.D.   On: 12/23/2020 16:11     No Known Allergies                                             Past Medical History:  Diagnosis Date   GERD (gastroesophageal reflux disease)     Social History   Socioeconomic History   Marital status: Single    Spouse name: Not on file   Number of children: Not on file   Years of education: Not on file   Highest education level: Not on file  Occupational History   Not on file  Tobacco Use   Smoking status: Every Day    Packs/day: 0.25    Years: 31.00    Pack years: 7.75  Types: Cigarettes   Smokeless tobacco: Never  Vaping Use   Vaping Use: Never used  Substance and Sexual Activity   Alcohol use: Not Currently   Drug use: Yes    Types: Marijuana    Comment: Last use 02/17/20   Sexual activity: Yes    Birth control/protection: None  Other Topics Concern   Not on file  Social History Narrative   Not on file   Social Determinants of Health   Financial Resource Strain: Not on file  Food Insecurity: Not on file  Transportation Needs: Not on file  Physical Activity: Not on file  Stress: Not on file   Social Connections: Not on file  Intimate Partner Violence: Not on file    Family History  Problem Relation Age of Onset   Multiple sclerosis Mother    Colon cancer Neg Hx    Rectal cancer Neg Hx    Stomach cancer Neg Hx      [name], MD 12/24/20 0937

## 2020-12-31 NOTE — Progress Notes (Signed)
   Referring Provider Carollee Leitz, MD [address],  [place] [address]   CC:  Chief Complaint  Patient presents with   Follow-up      Matthew Perry is an 50 y.o. male.  HPI: 50 year old male here for follow-up on his left hand boxer fracture that he sustained early May 2022.  He saw Dr. Claudia Desanctis at this time and elected to avoid surgical intervention.  He reports today that he is overall doing well.  He has continued to work and feels as if things have been progressing.  He is not having any pain.    Per EMR review he has had 2 visits with occupational therapy since his injury and has a next appointment scheduled for next week.  He reports that he has been working on grip strength with a soft ball.  Review of Systems General: No fevers or chills  Physical Exam Vitals with BMI 12/23/2020 11/26/2020 11/23/2020  Height 5\' 9"  5\' 9"  -  Weight 185 lbs 185 lbs -  BMI 27.03 50.09 -  Systolic 381 829 937  Diastolic 81 83 89  Pulse 63 78 76    General:  No acute distress,  Alert and oriented, Non-Toxic, Normal speech and affect Left hand: Palpable radial pulse.  Swelling significantly improved.  Distal extremities warm to touch and fingers well-perfused.  Sensation intact throughout.  Good range of motion of all 5 digits.  No tenderness with range of motion but there is some slight tenderness with palpation over the fifth metacarpal head.  He is able to extend fully and nearly fully flex his 5 digits.  There is no erythema or overlying skin changes.   Assessment/Plan  Recommend continuing with therapy.  Everything appears to be doing well and the fracture appears stable.  He nearly has full function with the exception of some decreased grip strength.  Recommend following up in 4 weeks for reevaluation with Dr. Claudia Desanctis.  I did discuss that this would likely be his last appointment pending any changes.  Recommend calling with questions or concerns.  Carola Rhine Breianna Delfino 01/01/2021, 8:22 AM

## 2021-01-01 ENCOUNTER — Ambulatory Visit (INDEPENDENT_AMBULATORY_CARE_PROVIDER_SITE_OTHER): Payer: PRIVATE HEALTH INSURANCE | Admitting: Surgical

## 2021-01-01 ENCOUNTER — Other Ambulatory Visit: Payer: Self-pay

## 2021-01-01 DIAGNOSIS — S62337A Displaced fracture of neck of fifth metacarpal bone, left hand, initial encounter for closed fracture: Secondary | ICD-10-CM

## 2021-01-06 ENCOUNTER — Ambulatory Visit: Payer: PRIVATE HEALTH INSURANCE | Attending: Plastic Surgery | Admitting: Occupational Therapy

## 2021-01-06 ENCOUNTER — Other Ambulatory Visit: Payer: Self-pay

## 2021-01-06 DIAGNOSIS — M6281 Muscle weakness (generalized): Secondary | ICD-10-CM | POA: Diagnosis present

## 2021-01-06 DIAGNOSIS — M79642 Pain in left hand: Secondary | ICD-10-CM | POA: Diagnosis present

## 2021-01-06 DIAGNOSIS — M25642 Stiffness of left hand, not elsewhere classified: Secondary | ICD-10-CM | POA: Diagnosis present

## 2021-01-06 NOTE — Therapy (Signed)
[facility] [address] [place], [place], [address] Phone: [phone]   Fax:  [phone]  Occupational Therapy Treatment  Patient Details  Name: Matthew Perry MRN: [id number] Date of Birth: 24-Oct-1970 Referring Provider (OT): Dr. Claudia Desanctis   Encounter Date: 01/06/2021   OT End of Session - 01/06/21 1613     Visit Number 3    Number of Visits 13    Date for OT Re-Evaluation 03/04/21    Authorization Type [employer]    OT Start Time 0718    OT Stop Time 0800    OT Time Calculation (min) 42 min    Activity Tolerance Patient tolerated treatment well             Past Medical History:  Diagnosis Date   GERD (gastroesophageal reflux disease)     No past surgical history on file.  There were no vitals filed for this visit.   Subjective Assessment - 01/06/21 1612     Subjective  Pt reports straps are falling off of splint    Currently in Pain? Yes    Pain Score 6     Pain Location Hand    Pain Descriptors / Indicators Aching    Pain Type Surgical pain    Pain Onset More than a month ago    Pain Frequency Intermittent    Aggravating Factors  movment    Pain Relieving Factors rest                      Treatment: Pt brought in his splint, it was very soiled and straps are falling off. Therapist cleaned splint and applied new velcro while pt was in fluidotherapy. Fluidotherapy x 11 mins for pain and stiffness, no adverse reactions. Ice pack end of session x 5 mins for pain, no adverse reactions.            OT Education - 01/06/21 1618     Education Details Tendon gliding Exercises, use of buddy strap, putty HEP- yellow, see pt instructions. Splint wear and care    Person(s) Educated Patient    Methods Explanation;Demonstration;Verbal cues;Handout    Comprehension Verbalized understanding;Returned demonstration;Verbal cues required              OT Short Term Goals - 12/08/20 0813        OT SHORT TERM GOAL #1   Title I with splint wear care and precautions    Time 6    Period Weeks    Status Achieved      OT SHORT TERM GOAL #2   Title I with inital HEP once cleared for ROM.    Time 6    Period Weeks    Status New               OT Long Term Goals - 12/03/20 1603       OT LONG TERM GOAL #1   Title I with updated HEP.    Time 12    Period Weeks    Status New      OT LONG TERM GOAL #2   Title Pt will demonstrate at least 90% composite finger flexion for LUE for increased functional use.    Time 12    Period Weeks    Status New      OT LONG TERM GOAL #3   Title Pt will demonstrate LUE grip strength of at least 40 lbs for work activities.    Time 12  Period Weeks    Status New      OT LONG TERM GOAL #4   Title Pt will report pain in L hand no greater than 3/10 with ADLs/IADLs.    Time 12    Period Weeks    Status New      OT LONG TERM GOAL #5   Title Pt will resume use of LUE as a non dominant asssit at least 80% of the time for ADLs/IADLs.    Time 12    Period Weeks    Status New                   Plan - 01/06/21 1614     Clinical Impression Statement Pt is cleared for A/ROM, P/ROM and strengthening at this time per Roetta Sessions PA-C. Pt reports he was instructed top wear splint for 4 more weeks    OT Occupational Profile and History Problem Focused Assessment - Including review of records relating to presenting problem    Occupational performance deficits (Please refer to evaluation for details): ADL's;IADL's;Work;Leisure;Social Participation    Body Structure / Function / Physical Skills ADL;Mobility;Strength;Flexibility;FMC;Pain;Coordination;Decreased knowledge of precautions;Decreased knowledge of use of DME;GMC;ROM;Dexterity;IADL;Sensation    Rehab Potential Good    Clinical Decision Making Limited treatment options, no task modification necessary    Comorbidities Affecting Occupational Performance: May have  comorbidities impacting occupational performance    Modification or Assistance to Complete Evaluation  No modification of tasks or assist necessary to complete eval    OT Frequency 1x / week   plus eval   OT Duration 12 weeks    OT Treatment/Interventions Self-care/ADL training;Therapeutic exercise;Splinting;Manual Therapy;Ultrasound;Neuromuscular education;Therapeutic activities;Paraffin;Cryotherapy;DME and/or AE instruction;Fluidtherapy;Electrical Stimulation;Moist Heat;Contrast Bath;Passive range of motion;Patient/family education    Plan fluido, A/ROM, P/ROM, strengthening    Consulted and Agree with Plan of Care Patient             Patient will benefit from skilled therapeutic intervention in order to improve the following deficits and impairments:   Body Structure / Function / Physical Skills: ADL, Mobility, Strength, Flexibility, FMC, Pain, Coordination, Decreased knowledge of precautions, Decreased knowledge of use of DME, GMC, ROM, Dexterity, IADL, Sensation       Visit Diagnosis: Stiffness of left hand, not elsewhere classified  Pain in left hand  Muscle weakness (generalized)    Problem List Patient Active Problem List   Diagnosis Date Noted   Left elbow pain 04/16/2020   Need for immunization against influenza 04/16/2020   Nontraumatic incomplete tear of right rotator cuff 01/11/2020    Damaris Geers 01/06/2021, 4:19 PM Theone Murdoch, OTR/L [phone] Phone: [phone] 4:22 PM 01/06/21  [facility] [address] [place], [place], [address] Phone: [phone]   Fax:  [phone]  Name: Matthew Perry MRN: [id number] Date of Birth: April 09, 1971

## 2021-01-06 NOTE — Patient Instructions (Signed)
Flexor Tendon Gliding (Active Hook Fist)   With fingers and knuckles straight, bend middle and tip joints. Do not bend large knuckles. Repeat _10-15___ times. Do _4-6___ sessions per day.  MP Flexion (Active)   With back of hand on table, bend large knuckles as far as they will go, keeping small joints straight. Repeat _10-15___ times. Do __4-6__ sessions per day. Activity: Reach into a narrow container.*      Finger Flexion / Extension   With palm up, bend fingers of left hand toward palm, making a  fist. Straighten fingers, opening fist. Repeat sequence _10-15___ times per session. Do _4-6__ sessions per day. Hand Variation: Palm down   Copyright  VHI. All rights reserved.     1. Grip Strengthening (Resistive Putty)   Squeeze putty using thumb and all fingers. Repeat _20___ times. Do __2__ sessions per day.   2. Roll putty into tube on table and pinch between each finger and thumb x 10 reps each. (can do ring and small finger together)     Copyright  VHI. All rights reserved.

## 2021-01-12 ENCOUNTER — Encounter (HOSPITAL_COMMUNITY): Payer: Self-pay | Admitting: Emergency Medicine

## 2021-01-12 ENCOUNTER — Ambulatory Visit: Payer: PRIVATE HEALTH INSURANCE | Admitting: Occupational Therapy

## 2021-01-12 ENCOUNTER — Other Ambulatory Visit: Payer: Self-pay

## 2021-01-12 ENCOUNTER — Ambulatory Visit (INDEPENDENT_AMBULATORY_CARE_PROVIDER_SITE_OTHER)
Admission: EM | Admit: 2021-01-12 | Discharge: 2021-01-12 | Disposition: A | Discharge status: Home / Self Care | Payer: PRIVATE HEALTH INSURANCE | Source: Home / Self Care | Attending: Family Medicine | Admitting: Family Medicine

## 2021-01-12 DIAGNOSIS — J069 Acute upper respiratory infection, unspecified: Secondary | ICD-10-CM | POA: Diagnosis not present

## 2021-01-12 DIAGNOSIS — M25642 Stiffness of left hand, not elsewhere classified: Secondary | ICD-10-CM

## 2021-01-12 DIAGNOSIS — U071 COVID-19: Secondary | ICD-10-CM | POA: Insufficient documentation

## 2021-01-12 DIAGNOSIS — M6281 Muscle weakness (generalized): Secondary | ICD-10-CM

## 2021-01-12 DIAGNOSIS — F17210 Nicotine dependence, cigarettes, uncomplicated: Secondary | ICD-10-CM | POA: Insufficient documentation

## 2021-01-12 DIAGNOSIS — M79642 Pain in left hand: Secondary | ICD-10-CM

## 2021-01-12 LAB — SARS CORONAVIRUS 2 (TAT 6-24 HRS): SARS Coronavirus 2: POSITIVE — AB

## 2021-01-12 NOTE — Discharge Instructions (Signed)
You have been tested for COVID-19 today. °If your test returns positive, you will receive a phone call from [facility] regarding your results. °Negative test results are not called. °Both positive and negative results area always visible on MyChart. °If you do not have a MyChart account, sign up instructions are provided in your discharge papers. °Please do not hesitate to contact us should you have questions or concerns. ° °

## 2021-01-12 NOTE — Therapy (Signed)
[facility] [address] [place], [place], [address] Phone: [phone]   Fax:  [phone]  Occupational Therapy Treatment  Patient Details  Name: Matthew Perry MRN: [id number] Date of Birth: [birth year] Referring Provider (OT): Dr. Claudia Desanctis   Encounter Date: 01/12/2021   OT End of Session - 01/12/21 0725     Visit Number 4    Number of Visits 13    Date for OT Re-Evaluation 03/04/21    Authorization Type [employer]    OT Start Time 0718    OT Stop Time 0756    OT Time Calculation (min) 38 min             Past Medical History:  Diagnosis Date   GERD (gastroesophageal reflux disease)     No past surgical history on file.  There were no vitals filed for this visit.   Subjective Assessment - 01/12/21 0725     Subjective  Pt denies pain    Limitations cleared for A/ROM, P/ROM and strengthening, wear splint x 4 weeks from his last MD appt with Roetta Sessions, PA-C    Currently in Pain? No/denies                     Treatment: Fluidotherapy x 10 mins to LUE, no adverse reactions. Treatment: A/ROM tendon gliding exercises, isolated MP flexion and extension, finger thumb opposition Gripper set at level 3 for sustained grip, min difficulty/ v.c Graded clothespins for sustained pinch with ring and small finger 1-8 #, min difficulty v.c Placing grooved pegs in pegboard with in hand manipulation while holding several pegs in hand while placing and removing from pegboard, min difficulty/ drops.                OT Short Term Goals - 12/08/20 0813       OT SHORT TERM GOAL #1   Title I with splint wear care and precautions    Time 6    Period Weeks    Status Achieved      OT SHORT TERM GOAL #2   Title I with inital HEP once cleared for ROM.    Time 6    Period Weeks    Status New               OT Long Term Goals - 12/03/20 1603       OT LONG TERM GOAL #1   Title I with  updated HEP.    Time 12    Period Weeks    Status New      OT LONG TERM GOAL #2   Title Pt will demonstrate at least 90% composite finger flexion for LUE for increased functional use.    Time 12    Period Weeks    Status New      OT LONG TERM GOAL #3   Title Pt will demonstrate LUE grip strength of at least 40 lbs for work activities.    Time 12    Period Weeks    Status New      OT LONG TERM GOAL #4   Title Pt will report pain in L hand no greater than 3/10 with ADLs/IADLs.    Time 12    Period Weeks    Status New      OT LONG TERM GOAL #5   Title Pt will resume use of LUE as a non dominant asssit at least 80% of the time for ADLs/IADLs.  Time 12    Period Weeks    Status New                   Plan - 01/12/21 0725     Clinical Impression Statement Pt is progressing towards goals with imrproving ROM. Pt is cleared for A/ROM, P/ROM and strengthening at this time per Roetta Sessions PA-C. Pt reports he was instructed to wear splint for 3 more weeks    OT Occupational Profile and History Problem Focused Assessment - Including review of records relating to presenting problem    Occupational performance deficits (Please refer to evaluation for details): ADL's;IADL's;Work;Leisure;Social Participation    Body Structure / Function / Physical Skills ADL;Mobility;Strength;Flexibility;FMC;Pain;Coordination;Decreased knowledge of precautions;Decreased knowledge of use of DME;GMC;ROM;Dexterity;IADL;Sensation    Rehab Potential Good    Clinical Decision Making Limited treatment options, no task modification necessary    Comorbidities Affecting Occupational Performance: May have comorbidities impacting occupational performance    Modification or Assistance to Complete Evaluation  No modification of tasks or assist necessary to complete eval    OT Frequency 1x / week   plus eval   OT Duration 12 weeks    OT Treatment/Interventions Self-care/ADL training;Therapeutic  exercise;Splinting;Manual Therapy;Ultrasound;Neuromuscular education;Therapeutic activities;Paraffin;Cryotherapy;DME and/or AE instruction;Fluidtherapy;Electrical Stimulation;Moist Heat;Contrast Bath;Passive range of motion;Patient/family education    Plan fluido, A/ROM, P/ROM, strengthening    Consulted and Agree with Plan of Care Patient             Patient will benefit from skilled therapeutic intervention in order to improve the following deficits and impairments:   Body Structure / Function / Physical Skills: ADL, Mobility, Strength, Flexibility, FMC, Pain, Coordination, Decreased knowledge of precautions, Decreased knowledge of use of DME, GMC, ROM, Dexterity, IADL, Sensation       Visit Diagnosis: No diagnosis found.    Problem List Patient Active Problem List   Diagnosis Date Noted   Left elbow pain 04/16/2020   Need for immunization against influenza 04/16/2020   Nontraumatic incomplete tear of right rotator cuff 01/11/2020    Ziara Thelander 01/12/2021, 7:28 AM  [facility] [address] [place], [place], [address] Phone: [phone]   Fax:  [phone]  Name: Franklyn Cafaro MRN: [id number] Date of Birth: [birth year]

## 2021-01-12 NOTE — ED Provider Notes (Signed)
  [facility]   [id number] 01/12/21 Arrival Time: [facility] PLAN:  1. Viral URI with cough    Discussed typical duration of viral illnesses. COVID-19 testing sent. OTC symptom care as needed. Work note provided.   Follow-up Information     Carollee Leitz, MD.   Specialty: Family Medicine Why: As needed. Contact information: [address] [place] [address] [phone]                 Reviewed expectations re: course of current medical issues. Questions answered. Outlined signs and symptoms indicating need for more acute intervention. Understanding verbalized. After Visit Summary given.   SUBJECTIVE: History from: patient. Matthew Perry is a 50 y.o. male who presents with worries regarding COVID-19. Known COVID-19 contact: unsure. Recent travel: none. Reports: nasal congestion, cough, HA, chills. Onset today. Denies: fever and difficulty breathing. Normal PO intake without n/v/d.   OBJECTIVE:  Vitals:   01/12/21 1101  BP: 126/85  Pulse: 66  Resp: 17  Temp: 99 F (37.2 C)  TempSrc: Oral  SpO2: 99%    General appearance: alert; no distress Eyes: PERRLA; EOMI; conjunctiva normal HENT: [place]; AT; with nasal congestion Neck: supple  Lungs: speaks full sentences without difficulty; unlabored Extremities: no edema Skin: warm and dry Neurologic: normal gait Psychological: alert and cooperative; normal mood and affect  Labs:  Labs Reviewed  SARS CORONAVIRUS 2 (TAT 6-24 HRS)    No Known Allergies  Past Medical History:  Diagnosis Date   GERD (gastroesophageal reflux disease)    Social History   Socioeconomic History   Marital status: Single    Spouse name: Not on file   Number of children: Not on file   Years of education: Not on file   Highest education level: Not on file  Occupational History   Not on file  Tobacco Use   Smoking status: Every Day    Packs/day: 0.25    Years: 31.00    Pack years: 7.75    Types:  Cigarettes   Smokeless tobacco: Never  Vaping Use   Vaping Use: Never used  Substance and Sexual Activity   Alcohol use: Not Currently   Drug use: Yes    Types: Marijuana    Comment: Last use 02/17/20   Sexual activity: Yes    Birth control/protection: None  Other Topics Concern   Not on file  Social History Narrative   Not on file   Social Determinants of Health   Financial Resource Strain: Not on file  Food Insecurity: Not on file  Transportation Needs: Not on file  Physical Activity: Not on file  Stress: Not on file  Social Connections: Not on file  Intimate Partner Violence: Not on file   Family History  Problem Relation Age of Onset   Multiple sclerosis Mother    Colon cancer Neg Hx    Rectal cancer Neg Hx    Stomach cancer Neg Hx    History reviewed. No pertinent surgical history.   [name], MD 01/12/21 [phone]

## 2021-01-12 NOTE — ED Triage Notes (Signed)
Pt is present today with nasal congestion, coughing, HA, and chills. Pt states that his sx started last night.

## 2021-01-21 ENCOUNTER — Ambulatory Visit: Payer: PRIVATE HEALTH INSURANCE | Attending: Plastic Surgery | Admitting: Occupational Therapy

## 2021-01-21 ENCOUNTER — Other Ambulatory Visit: Payer: Self-pay

## 2021-01-21 ENCOUNTER — Encounter: Payer: Self-pay | Admitting: Occupational Therapy

## 2021-01-21 DIAGNOSIS — M6281 Muscle weakness (generalized): Secondary | ICD-10-CM | POA: Insufficient documentation

## 2021-01-21 DIAGNOSIS — M79642 Pain in left hand: Secondary | ICD-10-CM | POA: Diagnosis present

## 2021-01-21 DIAGNOSIS — R600 Localized edema: Secondary | ICD-10-CM | POA: Diagnosis present

## 2021-01-21 DIAGNOSIS — M25642 Stiffness of left hand, not elsewhere classified: Secondary | ICD-10-CM | POA: Insufficient documentation

## 2021-01-21 NOTE — Therapy (Signed)
[facility] [address] [place], [place], [address] Phone: [phone]   Fax:  [phone]  Occupational Therapy Treatment  Patient Details  Name: Matthew Perry MRN: [id number] Date of Birth: 03/09/1971 Referring Provider (OT): Dr. Claudia Desanctis   Encounter Date: 01/21/2021   OT End of Session - 01/21/21 0723     Visit Number 5    Number of Visits 13    Date for OT Re-Evaluation 03/04/21    Authorization Type [employer]    OT Start Time 0719    OT Stop Time 0800    OT Time Calculation (min) 41 min             Past Medical History:  Diagnosis Date   GERD (gastroesophageal reflux disease)     History reviewed. No pertinent surgical history.  There were no vitals filed for this visit.   Subjective Assessment - 01/21/21 0721     Subjective  Pt denies pain    Limitations cleared for A/ROM, P/ROM and strengthening, wear splint x 4 weeks from his last MD appt with Roetta Sessions, PA-C    Currently in Pain? Yes    Pain Score 6     Pain Location Hand    Pain Orientation Left    Pain Descriptors / Indicators Aching    Pain Type Surgical pain    Pain Onset More than a month ago    Pain Frequency Intermittent    Aggravating Factors  movement    Pain Relieving Factors rest               Fluidotherapy x 10 mins to LUE, no adverse reactions for pain/stiffness.  A/ROM tendon gliding exercises, isolated MP flexion and extension, finger thumb opposition to each digit and base of 5th digit.  Reviewed yellow putty HEP for grip and individual finger pinch.  Pt returned demo each.  Gripper set at level 3 for sustained grip, min difficulty/v.c  Graded clothespins for sustained pinch with ring and small finger 1-8 #, min difficulty  Picking up coins with 5th digit and thumb to put in coin bank with min difficulty/pain.  Then picking up coins and manipulating in hand 1 at a time with min  difficulty/pain.            OT Short Term Goals - 01/21/21 0735       OT SHORT TERM GOAL #1   Title I with splint wear care and precautions    Time 6    Period Weeks    Status Achieved      OT SHORT TERM GOAL #2   Title I with inital HEP once cleared for ROM.    Time 6    Period Weeks    Status Achieved               OT Long Term Goals - 12/03/20 1603       OT LONG TERM GOAL #1   Title I with updated HEP.    Time 12    Period Weeks    Status New      OT LONG TERM GOAL #2   Title Pt will demonstrate at least 90% composite finger flexion for LUE for increased functional use.    Time 12    Period Weeks    Status New      OT LONG TERM GOAL #3   Title Pt will demonstrate LUE grip strength of at least 40 lbs for work activities.  Time 12    Period Weeks    Status New      OT LONG TERM GOAL #4   Title Pt will report pain in L hand no greater than 3/10 with ADLs/IADLs.    Time 12    Period Weeks    Status New      OT LONG TERM GOAL #5   Title Pt will resume use of LUE as a non dominant asssit at least 80% of the time for ADLs/IADLs.    Time 12    Period Weeks    Status New                   Plan - 01/21/21 8676     Clinical Impression Statement Pt is progressing towards goals with improving ROM (approximating WNL today); however moderate pain continues to be a barrier.  Pt is cleared for A/ROM, P/ROM and strengthening at this time per Roetta Sessions PA-C. Pt reports he sees doctor again at end of next week.    OT Occupational Profile and History Problem Focused Assessment - Including review of records relating to presenting problem    Occupational performance deficits (Please refer to evaluation for details): ADL's;IADL's;Work;Leisure;Social Participation    Body Structure / Function / Physical Skills ADL;Mobility;Strength;Flexibility;FMC;Pain;Coordination;Decreased knowledge of precautions;Decreased knowledge of use of  DME;GMC;ROM;Dexterity;IADL;Sensation    Rehab Potential Good    Clinical Decision Making Limited treatment options, no task modification necessary    Comorbidities Affecting Occupational Performance: May have comorbidities impacting occupational performance    Modification or Assistance to Complete Evaluation  No modification of tasks or assist necessary to complete eval    OT Frequency 1x / week   plus eval   OT Duration 12 weeks    OT Treatment/Interventions Self-care/ADL training;Therapeutic exercise;Splinting;Manual Therapy;Ultrasound;Neuromuscular education;Therapeutic activities;Paraffin;Cryotherapy;DME and/or AE instruction;Fluidtherapy;Electrical Stimulation;Moist Heat;Contrast Bath;Passive range of motion;Patient/family education    Plan fluido, A/ROM, P/ROM, strengthening, ?note to MD (pt sees MD at end of next week)    Consulted and Agree with Plan of Care Patient             Patient will benefit from skilled therapeutic intervention in order to improve the following deficits and impairments:   Body Structure / Function / Physical Skills: ADL, Mobility, Strength, Flexibility, FMC, Pain, Coordination, Decreased knowledge of precautions, Decreased knowledge of use of DME, GMC, ROM, Dexterity, IADL, Sensation       Visit Diagnosis: Stiffness of left hand, not elsewhere classified  Pain in left hand  Muscle weakness (generalized)    Problem List Patient Active Problem List   Diagnosis Date Noted   Left elbow pain 04/16/2020   Need for immunization against influenza 04/16/2020   Nontraumatic incomplete tear of right rotator cuff 01/11/2020    [facility] 01/21/2021, 8:04 AM  [facility] [address] [place], [place], [address] Phone: [phone]   Fax:  [phone]  Name: Lionell Matuszak MRN: [id number] Date of Birth: October 09, 1970  Vianne Bulls, OTR/L [facility] [address] [place], [place]  [address] [phone] phone [phone] 01/21/21 8:04 AM

## 2021-01-27 ENCOUNTER — Other Ambulatory Visit: Payer: Self-pay

## 2021-01-27 ENCOUNTER — Ambulatory Visit: Payer: PRIVATE HEALTH INSURANCE | Admitting: Occupational Therapy

## 2021-01-27 DIAGNOSIS — M6281 Muscle weakness (generalized): Secondary | ICD-10-CM

## 2021-01-27 DIAGNOSIS — M25642 Stiffness of left hand, not elsewhere classified: Secondary | ICD-10-CM | POA: Diagnosis not present

## 2021-01-27 DIAGNOSIS — R600 Localized edema: Secondary | ICD-10-CM

## 2021-01-27 DIAGNOSIS — M79642 Pain in left hand: Secondary | ICD-10-CM

## 2021-01-27 NOTE — Therapy (Signed)
[facility] [address] [place], [place], [address] Phone: [phone]   Fax:  [phone]  Occupational Therapy Treatment  Patient Details  Name: Matthew Perry MRN: [id number] Date of Birth: 1971-02-21 Referring Provider (OT): Dr. Claudia Desanctis   Encounter Date: 01/27/2021   OT End of Session - 01/27/21 0952     Visit Number 6    Number of Visits 13    Date for OT Re-Evaluation 03/04/21    Authorization Type [employer]    OT Start Time 0718    OT Stop Time 0754    OT Time Calculation (min) 36 min             Past Medical History:  Diagnosis Date   GERD (gastroesophageal reflux disease)     No past surgical history on file.  There were no vitals filed for this visit.   Subjective Assessment - 01/27/21 0951     Subjective  Pt reports pain is better    Limitations cleared for A/ROM, P/ROM and strengthening, wear splint x 4 weeks from his last MD appt with Roetta Sessions, PA-C    Currently in Pain? Yes    Pain Location Hand    Pain Orientation Left    Pain Descriptors / Indicators Aching    Pain Type Surgical pain    Pain Onset More than a month ago    Pain Frequency Intermittent    Aggravating Factors  movement    Pain Relieving Factors rest                      Treatment: Fluidotherapy x 10 mins to LUE, no adverse reactions.  A/ROM tendon gliding exercises, isolated MP flexion and extension, reverse blocking Gripper set at level 4 for sustained grip, min difficulty/ v.c Placing grooved pegs in pegboard with in hand manipulation while holding several pegs in hand while placing and removing from pegboard, min difficulty/ drops for increased coordination             OT Education - 01/27/21 0929     Education Details upgraded putty to red, for sustained grip and pinch, reviewed A/ROM compsite flexion, IP flexion and reverse blocking. discussed plans to wrap up soon, pt reports he would  like to be seen for 1 more visit.    Person(s) Educated Patient    Methods Explanation;Demonstration;Verbal cues    Comprehension Verbalized understanding;Returned demonstration              OT Short Term Goals - 01/21/21 0735       OT SHORT TERM GOAL #1   Title I with splint wear care and precautions    Time 6    Period Weeks    Status Achieved      OT SHORT TERM GOAL #2   Title I with inital HEP once cleared for ROM.    Time 6    Period Weeks    Status Achieved               OT Long Term Goals - 01/27/21 3329       OT LONG TERM GOAL #1   Title I with updated HEP.    Time 12    Period Weeks    Status Achieved      OT LONG TERM GOAL #2   Title Pt will demonstrate at least 90% composite finger flexion for LUE for increased functional use.    Time 12    Period  Weeks    Status Achieved      OT LONG TERM GOAL #3   Title Pt will demonstrate LUE grip strength of at least 40 lbs for work activities.    Time 12    Period Weeks    Status Achieved   95.9     OT LONG TERM GOAL #4   Title Pt will report pain in L hand no greater than 3/10 with ADLs/IADLs.    Time 12    Period Weeks    Status New      OT LONG TERM GOAL #5   Title Pt will resume use of LUE as a non dominant asssit at least 80% of the time for ADLs/IADLs.    Time 12    Period Weeks    Status New                   Plan - 01/27/21 0948     Clinical Impression Statement Pt is progressing towards goals with improving ROM and strength.    OT Occupational Profile and History Problem Focused Assessment - Including review of records relating to presenting problem    Occupational performance deficits (Please refer to evaluation for details): ADL's;IADL's;Work;Leisure;Social Participation    Body Structure / Function / Physical Skills ADL;Mobility;Strength;Flexibility;FMC;Pain;Coordination;Decreased knowledge of precautions;Decreased knowledge of use of DME;GMC;ROM;Dexterity;IADL;Sensation     OT Frequency 1x / week    OT Duration 12 weeks    OT Treatment/Interventions Self-care/ADL training;Therapeutic exercise;Splinting;Manual Therapy;Ultrasound;Neuromuscular education;Therapeutic activities;Paraffin;Cryotherapy;DME and/or AE instruction;Fluidtherapy;Electrical Stimulation;Moist Heat;Contrast Bath;Passive range of motion;Patient/family education    Plan fluido, A/ROM, P/ROM, strengthening, anticipate d/c next visit    Consulted and Agree with Plan of Care Patient             Patient will benefit from skilled therapeutic intervention in order to improve the following deficits and impairments:   Body Structure / Function / Physical Skills: ADL, Mobility, Strength, Flexibility, FMC, Pain, Coordination, Decreased knowledge of precautions, Decreased knowledge of use of DME, GMC, ROM, Dexterity, IADL, Sensation       Visit Diagnosis: Stiffness of left hand, not elsewhere classified  Pain in left hand  Muscle weakness (generalized)  Localized edema    Problem List Patient Active Problem List   Diagnosis Date Noted   Left elbow pain 04/16/2020   Need for immunization against influenza 04/16/2020   Nontraumatic incomplete tear of right rotator cuff 01/11/2020    Matthew Perry 01/27/2021, 9:53 AM  [facility] [address] [place], [place], [address] Phone: [phone]   Fax:  [phone]  Name: Matthew Perry MRN: [id number] Date of Birth: [birth year]

## 2021-02-03 ENCOUNTER — Ambulatory Visit: Payer: PRIVATE HEALTH INSURANCE | Admitting: Occupational Therapy

## 2021-02-04 ENCOUNTER — Ambulatory Visit (INDEPENDENT_AMBULATORY_CARE_PROVIDER_SITE_OTHER): Payer: PRIVATE HEALTH INSURANCE | Admitting: Plastic Surgery

## 2021-02-04 ENCOUNTER — Other Ambulatory Visit: Payer: Self-pay

## 2021-02-04 DIAGNOSIS — S62337A Displaced fracture of neck of fifth metacarpal bone, left hand, initial encounter for closed fracture: Secondary | ICD-10-CM | POA: Diagnosis not present

## 2021-02-04 NOTE — Progress Notes (Signed)
   Referring Provider Carollee Leitz, MD [place] [address] [place],  [place] [address]   CC:  Chief Complaint  Patient presents with   Follow-up      Matthew Perry is an 50 y.o. male.  HPI: Patient presents 2 months out from boxer's fracture to the left hand.  He feels like things are going well.  He reports full range of motion and just a little bit of pain and discomfort near his fifth metacarpal head.  He is back at work.  Review of Systems General: Denies fevers and chills  Physical Exam Vitals with BMI 01/12/2021 12/23/2020 11/26/2020  Height - 5\' 9"  5\' 9"   Weight - 185 lbs 185 lbs  BMI - 07.62 26.33  Systolic 354 562 563  Diastolic 85 81 83  Pulse 66 63 78    General:  No acute distress,  Alert and oriented, Non-Toxic, Normal speech and affect Left hand: Fingers well-perfused normal cap refill palp radial pulse.  Sensation is intact throughout.  He has full range of motion.  Assessment/Plan Patient is clinically healed from boxer's fracture.  Would recommend following up with [place] on an as-needed basis.  All of his questions were answered and we will see him again as needed.  [name] 02/04/2021, 9:19 AM

## 2021-02-10 ENCOUNTER — Ambulatory Visit: Payer: PRIVATE HEALTH INSURANCE | Admitting: Occupational Therapy

## 2021-03-24 ENCOUNTER — Other Ambulatory Visit: Payer: Self-pay

## 2021-03-24 ENCOUNTER — Ambulatory Visit
Admission: RE | Admit: 2021-03-24 | Discharge: 2021-03-24 | Disposition: A | Discharge status: Home / Self Care | Payer: PRIVATE HEALTH INSURANCE | Source: Ambulatory Visit | Attending: Student | Admitting: Student

## 2021-03-24 VITALS — BP 135/92 | HR 65 | Temp 98.5°F | Resp 20

## 2021-03-24 DIAGNOSIS — K219 Gastro-esophageal reflux disease without esophagitis: Secondary | ICD-10-CM

## 2021-03-24 MED ORDER — OMEPRAZOLE 20 MG PO CPDR: 20.0000 mg | DELAYED_RELEASE_CAPSULE | Freq: Every day | ORAL | 0 refills | Status: DC

## 2021-03-24 MED ORDER — FAMOTIDINE 20 MG PO TABS: 20.0000 mg | ORAL_TABLET | Freq: Two times a day (BID) | ORAL | 2 refills | Status: DC

## 2021-03-24 NOTE — ED Triage Notes (Addendum)
Pt reports, abd pain to epigastric region, pt states he is not able to have BM, LBM today (small). Pt reports It feeling like he has acid reflux and feels like food does not go down.  Started: June 17,2022

## 2021-03-24 NOTE — Discharge Instructions (Addendum)
-  Prilosec once daily x14 days. -Pepcid (famotidine) taken with breakfast and dinner daily. Try for one week. Continue for longer if symptoms persist. You can take this as needed. -If this medication is helping, you can continue it for longer.  -Limit spicy foods, ibuprofen, alcohol

## 2021-03-24 NOTE — ED Provider Notes (Signed)
[facility]    CSN: [id number] Arrival date & time: 03/24/21  1338      History   Chief Complaint Chief Complaint  Patient presents with   Abdominal Pain    HPI Matthew Perry is a 50 y.o. male presenting with abdominal pain x3 months. Medical history GERD, unmedicated.  Notes 3 months of burning epigastric pain, worse with eating.  Also nausea without vomiting.  Denies radiation of the pain to the chest, back, arm, jaw.  Symptoms are aggravated by eating spicy food, denies NSAID or alcohol use.  Denies changes in bowel or bladder function like constipation, diarrhea. TUMS provides temporary relief.  Here today because work made him get checked out.  HPI  Past Medical History:  Diagnosis Date   GERD (gastroesophageal reflux disease)     Patient Active Problem List   Diagnosis Date Noted   Left elbow pain 04/16/2020   Need for immunization against influenza 04/16/2020   Nontraumatic incomplete tear of right rotator cuff 01/11/2020    History reviewed. No pertinent surgical history.     Home Medications    Prior to Admission medications   Medication Sig Start Date End Date Taking? Authorizing Provider  famotidine (PEPCID) 20 MG tablet Take 1 tablet (20 mg total) by mouth 2 (two) times daily. Take 1 pill with breakfast and 1 pill with dinner for 1 week.  After this, you can take as needed up to twice daily with meals. 03/24/21  Yes Hazel Sams, PA-C  omeprazole (PRILOSEC) 20 MG capsule Take 1 capsule (20 mg total) by mouth daily. Take with breakfast or dinner x14 days. 03/24/21  Yes Hazel Sams, PA-C    Family History Family History  Problem Relation Age of Onset   Multiple sclerosis Mother    Colon cancer Neg Hx    Rectal cancer Neg Hx    Stomach cancer Neg Hx     Social History Social History   Tobacco Use   Smoking status: Every Day    Packs/day: 0.25    Years: 31.00    Pack years: 7.75    Types: Cigarettes   Smokeless tobacco: Never   Vaping Use   Vaping Use: Never used  Substance Use Topics   Alcohol use: Yes   Drug use: Yes    Types: Marijuana     Allergies   Patient has no known allergies.   Review of Systems Review of Systems  Constitutional:  Negative for appetite change, chills, diaphoresis, fever and unexpected weight change.  HENT:  Negative for congestion, ear pain, sinus pressure, sinus pain, sneezing, sore throat and trouble swallowing.   Respiratory:  Negative for cough, chest tightness and shortness of breath.   Cardiovascular:  Negative for chest pain.  Gastrointestinal:  Positive for abdominal pain. Negative for abdominal distention, anal bleeding, blood in stool, constipation, diarrhea, nausea, rectal pain and vomiting.  Genitourinary:  Negative for dysuria, flank pain, frequency and urgency.  Musculoskeletal:  Negative for back pain and myalgias.  Neurological:  Negative for dizziness, light-headedness and headaches.  All other systems reviewed and are negative.   Physical Exam Triage Vital Signs ED Triage Vitals  Enc Vitals Group     BP      Pulse      Resp      Temp      Temp src      SpO2      Weight      Height  Head Circumference      Peak Flow      Pain Score      Pain Loc      Pain Edu?      Excl. in [place]?    No data found.  Updated Vital Signs BP (!) 135/92 (BP Location: Right Arm)   Pulse 65   Temp 98.5 F (36.9 C) (Oral)   Resp 20   SpO2 97%   Visual Acuity Right Eye Distance:   Left Eye Distance:   Bilateral Distance:    Right Eye Near:   Left Eye Near:    Bilateral Near:     Physical Exam Vitals reviewed.  Constitutional:      General: He is not in acute distress.    Appearance: Normal appearance. He is not ill-appearing.  HENT:     Head: Normocephalic and atraumatic.     Mouth/Throat:     Mouth: Mucous membranes are moist.     Comments: Moist mucous membranes Eyes:     Extraocular Movements: Extraocular movements intact.     Pupils:  Pupils are equal, round, and reactive to light.  Cardiovascular:     Rate and Rhythm: Normal rate and regular rhythm.     Pulses:          Radial pulses are 2+ on the right side and 2+ on the left side.     Heart sounds: Normal heart sounds.     Comments: Radial pulses equal and regular Pulmonary:     Effort: Pulmonary effort is normal.     Breath sounds: Normal breath sounds. No wheezing, rhonchi or rales.  Abdominal:     General: Bowel sounds are normal. There is no distension.     Palpations: Abdomen is soft. There is no mass.     Tenderness: There is abdominal tenderness in the epigastric area. There is no right CVA tenderness, left CVA tenderness, guarding or rebound. Negative signs include Murphy's sign, Rovsing's sign and McBurney's sign.     Comments: Epigastric tenderness to palpation, without guarding or rebound. BS full throughout.  Skin:    General: Skin is warm.     Capillary Refill: Capillary refill takes less than 2 seconds.     Comments: Good skin turgor  Neurological:     General: No focal deficit present.     Mental Status: He is alert and oriented to person, place, and time.  Psychiatric:        Mood and Affect: Mood normal.        Behavior: Behavior normal.     UC Treatments / Results  Labs (all labs ordered are listed, but only abnormal results are displayed) Labs Reviewed - No data to display  EKG   Radiology No results found.  Procedures Procedures (including critical care time)  Medications Ordered in UC Medications - No data to display  Initial Impression / Assessment and Plan / UC Course  I have reviewed the triage vital signs and the nursing notes.  Pertinent labs & imaging results that were available during my care of the patient were reviewed by me and considered in my medical decision making (see chart for details).     This patient is a very pleasant 50 y.o. year old male presenting with GERD. Afebrile, nontachy. Trial of prilosec and  famotidine. Reduce spicy food consumption. ED return precautions discussed. Patient verbalizes understanding and agreement.    Final Clinical Impressions(s) / UC Diagnoses   Final diagnoses:  Gastroesophageal reflux  disease without esophagitis     Discharge Instructions      -Prilosec once daily x14 days. -Pepcid (famotidine) taken with breakfast and dinner daily. Try for one week. Continue for longer if symptoms persist. You can take this as needed. -If this medication is helping, you can continue it for longer.  -Limit spicy foods, ibuprofen, alcohol      ED Prescriptions     Medication Sig Dispense Auth. Provider   famotidine (PEPCID) 20 MG tablet Take 1 tablet (20 mg total) by mouth 2 (two) times daily. Take 1 pill with breakfast and 1 pill with dinner for 1 week.  After this, you can take as needed up to twice daily with meals. 30 tablet Hazel Sams, PA-C   omeprazole (PRILOSEC) 20 MG capsule Take 1 capsule (20 mg total) by mouth daily. Take with breakfast or dinner x14 days. 14 capsule Hazel Sams, PA-C      PDMP not reviewed this encounter.   Hazel Sams, PA-C 03/24/21 1506

## 2021-06-01 ENCOUNTER — Encounter: Payer: Self-pay | Admitting: Nurse Practitioner

## 2021-06-01 ENCOUNTER — Other Ambulatory Visit: Payer: Self-pay

## 2021-06-01 ENCOUNTER — Ambulatory Visit (INDEPENDENT_AMBULATORY_CARE_PROVIDER_SITE_OTHER): Payer: PRIVATE HEALTH INSURANCE | Admitting: Nurse Practitioner

## 2021-06-01 VITALS — BP 114/72 | HR 76 | Temp 97.9°F | Ht 68.8 in | Wt 172.6 lb

## 2021-06-01 DIAGNOSIS — Z720 Tobacco use: Secondary | ICD-10-CM | POA: Diagnosis not present

## 2021-06-01 DIAGNOSIS — Z23 Encounter for immunization: Secondary | ICD-10-CM

## 2021-06-01 DIAGNOSIS — K219 Gastro-esophageal reflux disease without esophagitis: Secondary | ICD-10-CM

## 2021-06-01 DIAGNOSIS — R634 Abnormal weight loss: Secondary | ICD-10-CM | POA: Diagnosis not present

## 2021-06-01 DIAGNOSIS — Z13228 Encounter for screening for other metabolic disorders: Secondary | ICD-10-CM

## 2021-06-01 DIAGNOSIS — Z7689 Persons encountering health services in other specified circumstances: Secondary | ICD-10-CM

## 2021-06-01 MED ORDER — NICOTINE 7 MG/24HR TD PT24: 7.0000 mg | MEDICATED_PATCH | TRANSDERMAL | 0 refills | Status: AC

## 2021-06-01 NOTE — Progress Notes (Signed)
I,Katawbba Wiggins,acting as a Education administrator for [employer], NP.,have documented all relevant documentation on the behalf of [employer], NP,as directed by  Bary Castilla, NP while in the presence of Bary Castilla, NP.  This visit occurred during the SARS-CoV-2 public health emergency.  Safety protocols were in place, including screening questions prior to the visit, additional usage of staff PPE, and extensive cleaning of exam room while observing appropriate contact time as indicated for disinfecting solutions.  Subjective:     Patient ID: Matthew Perry , male    DOB: 04/02/1971 , 50 y.o.   MRN: [id number]   No chief complaint on file.   HPI  The patient is here today to establish care.  The patient said that he has been having trouble with his digestive system. He has been smoking since he was 14. 1/2-1 pack a day. He is losing weight when he is not trying. He has not seen a primary care doctor in years.      Past Medical History:  Diagnosis Date   GERD (gastroesophageal reflux disease)      Family History  Problem Relation Age of Onset   Multiple sclerosis Mother    Colon cancer Neg Hx    Rectal cancer Neg Hx    Stomach cancer Neg Hx      Current Outpatient Medications:    famotidine (PEPCID) 20 MG tablet, Take 1 tablet (20 mg total) by mouth 2 (two) times daily. Take 1 pill with breakfast and 1 pill with dinner for 1 week.  After this, you can take as needed up to twice daily with meals., Disp: 30 tablet, Rfl: 2   nicotine (NICODERM CQ - DOSED IN MG/24 HR) 7 mg/24hr patch, Place 1 patch (7 mg total) onto the skin daily., Disp: 30 patch, Rfl: 0   omeprazole (PRILOSEC) 20 MG capsule, Take 1 capsule (20 mg total) by mouth daily. Take with breakfast or dinner x14 days., Disp: 14 capsule, Rfl: 0   No Known Allergies   Review of Systems  Constitutional:  Positive for unexpected weight change. Negative for chills, fatigue and fever.  HENT:  Negative for  congestion, rhinorrhea, sinus pain and sneezing.   Respiratory: Negative.  Negative for cough, shortness of breath and wheezing.   Cardiovascular: Negative.  Negative for chest pain and palpitations.  Gastrointestinal: Negative.        Hx of GERD   Musculoskeletal:  Negative for arthralgias and myalgias.  Neurological:  Negative for headaches.  Psychiatric/Behavioral: Negative.    All other systems reviewed and are negative.   Today's Vitals   06/01/21 1612  BP: 114/72  Pulse: 76  Temp: 97.9 F (36.6 C)  Weight: 172 lb 9.6 oz (78.3 kg)  Height: 5' 8.8" (1.748 m)   Body mass index is 25.64 kg/m.  Wt Readings from Last 3 Encounters:  06/01/21 172 lb 9.6 oz (78.3 kg)  12/23/20 185 lb (83.9 kg)  11/26/20 185 lb (83.9 kg)    BP Readings from Last 3 Encounters:  06/01/21 114/72  03/24/21 (!) 135/92  01/12/21 126/85    Objective:  Physical Exam Constitutional:      Appearance: Normal appearance. He is obese.  HENT:     Head: Normocephalic and atraumatic.  Cardiovascular:     Rate and Rhythm: Normal rate and regular rhythm.     Pulses: Normal pulses.     Heart sounds: Normal heart sounds. No murmur heard. Pulmonary:     Effort: Pulmonary effort is normal. No  respiratory distress.     Breath sounds: Normal breath sounds. No wheezing.  Skin:    Capillary Refill: Capillary refill takes less than 2 seconds.  Neurological:     Mental Status: He is alert.        Assessment And Plan:     1. Encounter to establish care --Patient is here to establish care. Lebow Majestic over patient medical, family, social and surgical history. -Reviewed with patient their medications and any allergies  -Reviewed with patient their sexual orientation, drug/tobacco and alcohol use -Dicussed any new concerns with patient  -recommended patient comes in for a physical exam and complete blood work.  -Educated patient about the importance of annual screenings and immunizations.  -Advised patient to eat  a healthy diet along with exercise for atleast 30-45 min atleast 4-5 days of the week.   2. Gastroesophageal reflux disease without esophagitis -Taking Prilosec and famotidine  -Avoid spicy food consumption  - Ambulatory referral to Gastroenterology  3. Unintentional weight loss of more than 10 pounds -CT Chest lung CA screen low dose  -Greater than 30 year of smoking cigarettes.  -Family history of lung cancer.  - CMP14+EGFR - CBC with Diff  4. Encounter for screening for metabolic disorder - OHY07+PXTG - Hemoglobin A1c - Lipid panel  5. Nicotine abuse - CT CHEST LUNG CA SCREEN LOW DOSE W/O CM; Future - nicotine (NICODERM CQ - DOSED IN MG/24 HR) 7 mg/24hr patch; Place 1 patch (7 mg total) onto the skin daily.  Dispense: 30 patch; Refill: 0 -Greater than 30 pack years. Smoking since he was 64.  -Family history of lung cancer; unintentional weight loss   6. Need for influenza vaccination - Flu Vaccine QUAD 6+ mos PF IM (Fluarix Quad PF)   The patient was encouraged to call or send a message through [place] for any questions or concerns.   Follow up: 3 months for physical   Side effects and appropriate use of all the medication(s) were discussed with the patient today. Patient advised to use the medication(s) as directed by their healthcare provider. The patient was encouraged to read, review, and understand all associated package inserts and contact our office with any questions or concerns. The patient accepts the risks of the treatment plan and had an opportunity to ask questions.   Staying healthy and adopting a healthy lifestyle for your overall health is important. You should eat 7 or more servings of fruits and vegetables per day. You should drink plenty of water to keep yourself hydrated and your kidneys healthy. This includes about 65-80+ fluid ounces of water. Limit your intake of animal fats especially for elevated cholesterol. Avoid highly processed food and limit your  salt intake if you have hypertension. Avoid foods high in saturated/Trans fats. Along with a healthy diet it is also very important to maintain time for yourself to maintain a healthy mental health with low stress levels. You should get atleast 150 min of moderate intensity exercise weekly for a healthy heart. Along with eating right and exercising, aim for at least 7-9 hours of sleep daily.  Eat more whole grains which includes barley, wheat berries, oats, brown rice and whole wheat pasta. Use healthy plant oils which include olive, soy, corn, sunflower and peanut. Limit your caffeine and sugary drinks. Limit your intake of fast foods. Limit milk and dairy products to one or two daily servings.   Patient was given opportunity to ask questions. Patient verbalized understanding of the plan and was able to  repeat key elements of the plan. All questions were answered to their satisfaction.  Raman Jenille Laszlo, DNP   I, Raman Tammatha Cobb have reviewed all documentation for this visit. The documentation on 06/01/21 for the exam, diagnosis, procedures, and orders are all accurate and complete.     IF YOU HAVE BEEN REFERRED TO A SPECIALIST, IT MAY TAKE 1-2 WEEKS TO SCHEDULE/PROCESS THE REFERRAL. IF YOU HAVE NOT HEARD FROM US/SPECIALIST IN TWO WEEKS, PLEASE GIVE [place] A CALL AT [phone].   THE PATIENT IS ENCOURAGED TO PRACTICE SOCIAL DISTANCING DUE TO THE COVID-19 PANDEMIC.

## 2021-06-02 LAB — CBC WITH DIFFERENTIAL/PLATELET
Basophils Absolute: 0 10*3/uL (ref 0.0–0.2)
Basos: 0 %
EOS (ABSOLUTE): 0.1 10*3/uL (ref 0.0–0.4)
Eos: 1 %
Hematocrit: 42.7 % (ref 37.5–51.0)
Hemoglobin: 14.5 g/dL (ref 13.0–17.7)
Immature Grans (Abs): 0 10*3/uL (ref 0.0–0.1)
Immature Granulocytes: 0 %
Lymphocytes Absolute: 1.9 10*3/uL (ref 0.7–3.1)
Lymphs: 54 %
MCH: 33 pg (ref 26.6–33.0)
MCHC: 34 g/dL (ref 31.5–35.7)
MCV: 97 fL (ref 79–97)
Monocytes Absolute: 0.3 10*3/uL (ref 0.1–0.9)
Monocytes: 8 %
Neutrophils Absolute: 1.4 10*3/uL (ref 1.4–7.0)
Neutrophils: 37 %
Platelets: 158 10*3/uL (ref 150–450)
RBC: 4.4 x10E6/uL (ref 4.14–5.80)
RDW: 11.8 % (ref 11.6–15.4)
WBC: 3.6 10*3/uL (ref 3.4–10.8)

## 2021-06-02 LAB — HEMOGLOBIN A1C
Est. average glucose Bld gHb Est-mCnc: 91 mg/dL
Hgb A1c MFr Bld: 4.8 % (ref 4.8–5.6)

## 2021-06-02 LAB — CMP14+EGFR
ALT: 16 IU/L (ref 0–44)
AST: 15 IU/L (ref 0–40)
Albumin/Globulin Ratio: 1.8 (ref 1.2–2.2)
Albumin: 4.4 g/dL (ref 3.8–4.9)
Alkaline Phosphatase: 85 IU/L (ref 44–121)
BUN/Creatinine Ratio: 11 (ref 9–20)
BUN: 14 mg/dL (ref 6–24)
Bilirubin Total: 0.4 mg/dL (ref 0.0–1.2)
CO2: 22 mmol/L (ref 20–29)
Calcium: 9.1 mg/dL (ref 8.7–10.2)
Chloride: 105 mmol/L (ref 96–106)
Creatinine, Ser: 1.23 mg/dL (ref 0.76–1.27)
Globulin, Total: 2.4 g/dL (ref 1.5–4.5)
Glucose: 89 mg/dL (ref 70–99)
Potassium: 4 mmol/L (ref 3.5–5.2)
Sodium: 143 mmol/L (ref 134–144)
Total Protein: 6.8 g/dL (ref 6.0–8.5)
eGFR: 71 mL/min/{1.73_m2} (ref >59)

## 2021-06-02 LAB — LIPID PANEL
Chol/HDL Ratio: 2.8 ratio (ref 0.0–5.0)
Cholesterol, Total: 187 mg/dL (ref 100–199)
HDL: 67 mg/dL (ref >39)
LDL Chol Calc (NIH): 91 mg/dL (ref 0–99)
Triglycerides: 172 mg/dL — ABNORMAL HIGH (ref 0–149)
VLDL Cholesterol Cal: 29 mg/dL (ref 5–40)

## 2021-07-01 ENCOUNTER — Other Ambulatory Visit: Payer: Self-pay

## 2021-07-01 ENCOUNTER — Ambulatory Visit
Admission: RE | Admit: 2021-07-01 | Discharge: 2021-07-01 | Disposition: A | Discharge status: Home / Self Care | Payer: PRIVATE HEALTH INSURANCE | Source: Ambulatory Visit | Attending: Nurse Practitioner | Admitting: Nurse Practitioner

## 2021-07-01 DIAGNOSIS — Z720 Tobacco use: Secondary | ICD-10-CM

## 2021-07-04 ENCOUNTER — Other Ambulatory Visit: Payer: Self-pay | Admitting: Nurse Practitioner

## 2021-07-04 DIAGNOSIS — J438 Other emphysema: Secondary | ICD-10-CM

## 2021-08-04 IMAGING — CR DG CERVICAL SPINE COMPLETE 4+V
1 series · 1 of 1 positions shown · non-contrast
Comparison: None

CLINICAL DATA: Cervical radiculopathy, RIGHT shoulder pain,
numbness RIGHT thumb for over 1 month, no injury

EXAM:
CERVICAL SPINE - COMPLETE 4+ VIEW

[c-spine ap]
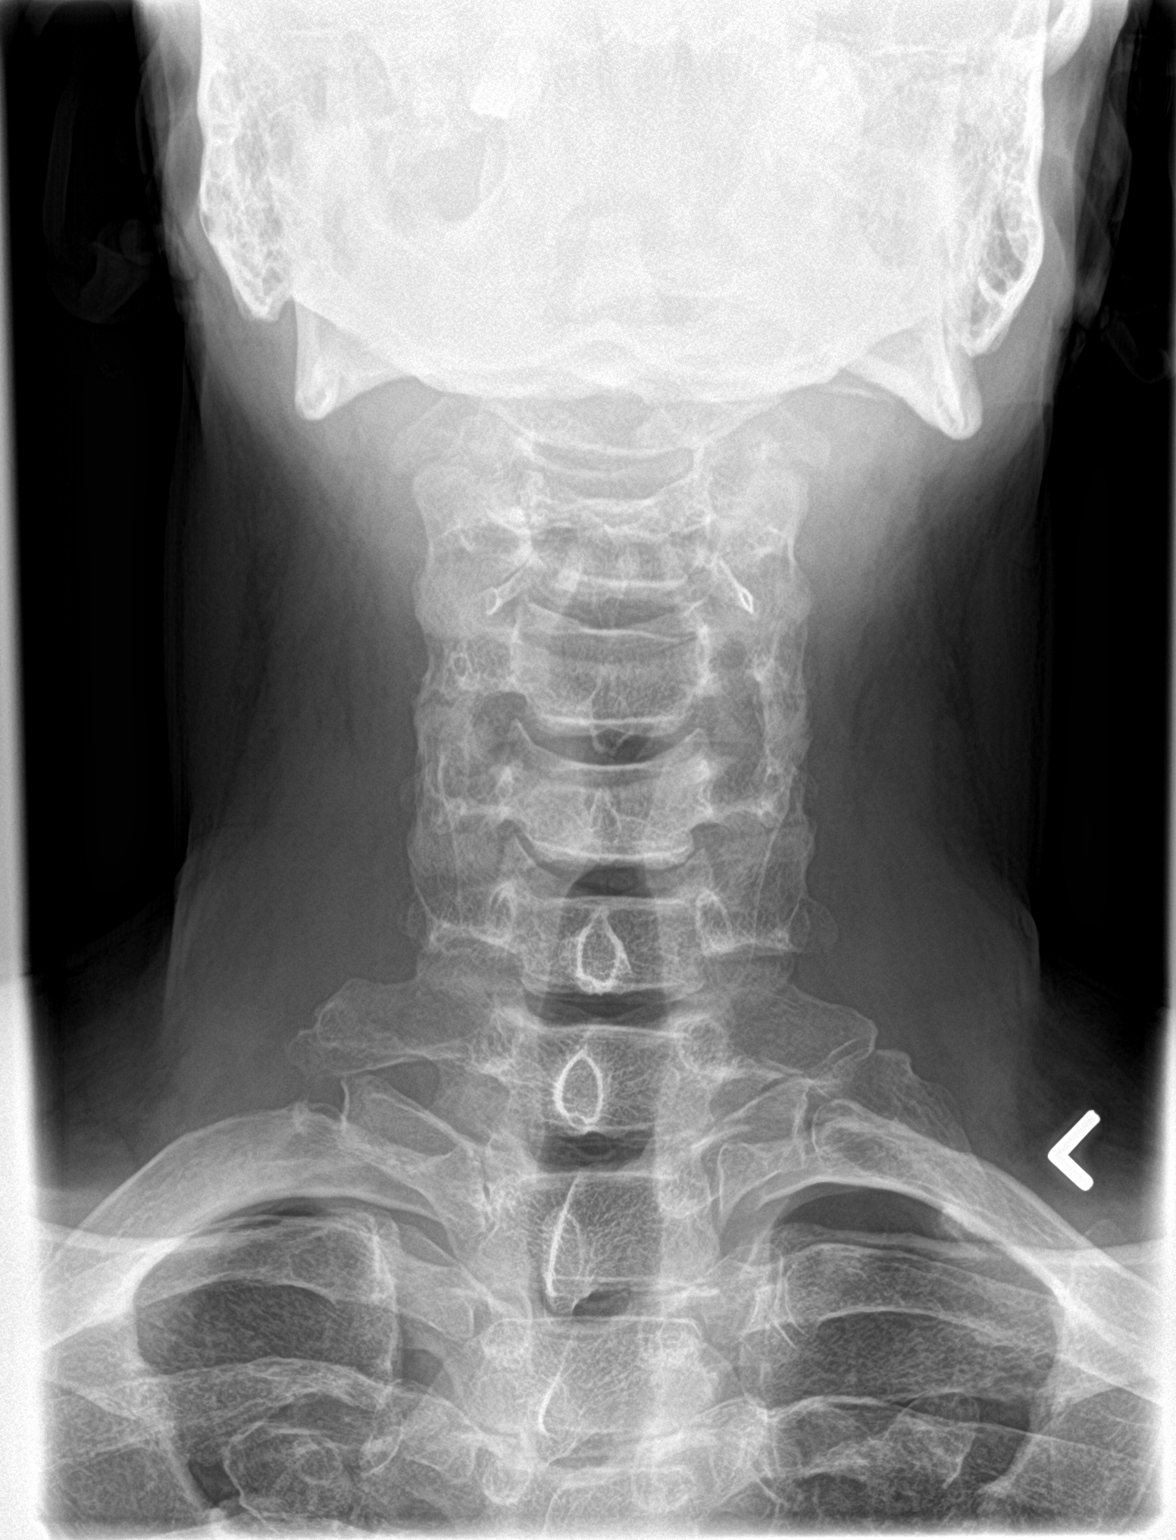

[1 of 1 positions shown; findings below may reference images not displayed]

FINDINGS: Prevertebral soft tissues normal thickness.

Osseous mineralization normal.

Vertebral body and disc space heights maintained.

No fracture, subluxation, or bone destruction.

Bony foramina patent.

Lung apices clear.
IMPRESSION: Normal exam.

## 2021-09-20 NOTE — Progress Notes (Incomplete)
? ? ?  09/20/2021 ?Tomasa Hosteller ?[id number] ?13-Jan-1971 ? ? ?ASSESSMENT AND PLAN:  ?*** ?There are no diagnoses linked to this encounter. ? ? ?History of Present Illness:  ?51 y.o. male  with a past medical history of *** and others listed below, known to Dr. Silverio Decamp returns to clinic today for evaluation of reflux. ?Known to Dr. Silverio Decamp ?Chronic pancreatitis 03/02/2020 colonoscopy with good prep, 5 hyperplastic sessile polyps nonbleeding internal hemorrhoids ? ?Previous GI history: ? ?No results found. ? ?Current Medications:  ? ? ? ? ? ? ?Current Outpatient Medications (Other):  ?  famotidine (PEPCID) 20 MG tablet, Take 1 tablet (20 mg total) by mouth 2 (two) times daily. Take 1 pill with breakfast and 1 pill with dinner for 1 week.  After this, you can take as needed up to twice daily with meals. ?  omeprazole (PRILOSEC) 20 MG capsule, Take 1 capsule (20 mg total) by mouth daily. Take with breakfast or dinner x14 days. ? ?Surgical History:  ?He  has no past surgical history on file. ?Family History:  ?His family history includes Multiple sclerosis in his mother. ?Social History:  ? reports that he has been smoking cigarettes. He has a 15.50 pack-year smoking history. He has never used smokeless tobacco. He reports current alcohol use. He reports current drug use. Drug: Marijuana. ? ?Current Medications, Allergies, Past Medical History, Past Surgical History, Family History and Social History were reviewed in [employer] record. ? ?Physical Exam: ?There were no vitals taken for this visit. ?General:   Pleasant, well developed male in no acute distress ?Eyes: {sclerae:26738},conjunctive {conjuctiva:26739}  ?Heart:  {HEART EXAM HEM/ONC:21750} ?Pulm: Clear anteriorly; no wheezing ?Abdomen:  {BlankSingle:19197::"Distended","Ridged","Soft"}, {BlankSingle:19197::"Flat","Obese"} AB, skin exam {ABDOMEN SKIN EXAM:22649}, {BlankSingle:19197::"Absent","Hyperactive,  tinkling","Hypoactive","Sluggish","Normal"} bowel sounds. {Desc; pc desc - abdomen tenderness:5168} tenderness {anatomy; site abdomen:5010}. {BlankMultiple:19196::"Without guarding","With guarding","Without rebound","With rebound"}, {Exam; abdomen organomegaly:15152}. ?Extremities:  {With/Without:304960234} edema. Peripheral pulses intact.  ?Neurologic:  Alert and  oriented x4;  grossly normal neurologically. ?Skin:   Dry and intact without significant lesions or rashes. ?Psychiatric: Demonstrates good judgement and reason without abnormal affect or behaviors. ? ?Vladimir Crofts, PA-C ?09/20/21 ?

## 2021-09-21 ENCOUNTER — Ambulatory Visit: Payer: PRIVATE HEALTH INSURANCE | Admitting: Physician Assistant

## 2021-09-29 ENCOUNTER — Encounter: Payer: Self-pay | Admitting: Nurse Practitioner

## 2021-09-29 ENCOUNTER — Other Ambulatory Visit: Payer: Self-pay

## 2021-09-29 ENCOUNTER — Ambulatory Visit (INDEPENDENT_AMBULATORY_CARE_PROVIDER_SITE_OTHER): Payer: PRIVATE HEALTH INSURANCE | Admitting: Nurse Practitioner

## 2021-09-29 VITALS — BP 120/98 | HR 59 | Temp 98.1°F | Ht 68.0 in | Wt 177.0 lb

## 2021-09-29 DIAGNOSIS — K219 Gastro-esophageal reflux disease without esophagitis: Secondary | ICD-10-CM | POA: Diagnosis not present

## 2021-09-29 DIAGNOSIS — Z0000 Encounter for general adult medical examination without abnormal findings: Secondary | ICD-10-CM | POA: Diagnosis not present

## 2021-09-29 DIAGNOSIS — Z8616 Personal history of COVID-19: Secondary | ICD-10-CM | POA: Diagnosis not present

## 2021-09-29 DIAGNOSIS — Z23 Encounter for immunization: Secondary | ICD-10-CM

## 2021-09-29 MED ORDER — OMEPRAZOLE 20 MG PO CPDR: 20.0000 mg | DELAYED_RELEASE_CAPSULE | Freq: Every day | ORAL | 0 refills | Status: DC

## 2021-09-29 NOTE — Patient Instructions (Signed)

## 2021-09-29 NOTE — Progress Notes (Signed)
?I,Matthew Perry,acting as a scribe for Matthew Brine, FNP.,have documented all relevant documentation on the behalf of Matthew Brine, FNP,as directed by  Matthew Brine, FNP while in the presence of Matthew Perry, [place].  ? ?This visit occurred during the SARS-CoV-2 public health emergency.  Safety protocols were in place, including screening questions prior to the visit, additional usage of staff PPE, and extensive cleaning of exam room while observing appropriate contact time as indicated for disinfecting solutions. ? ?Subjective:  ?  ? Patient ID: Matthew Perry , male    DOB: 1971-02-26 , 51 y.o.   MRN: [id number] ? ? ?Chief Complaint  ?Patient presents with  ? Annual Exam  ? ? ?HPI ? ?Pt here today for HM. He has no specific questions or concerns.  ?He will sch to get booster at his local preferred pharmacy.  ?He had covid 3 months ago ?Reports he is established by urologist ? ?Body mass index is 26.91 kg/m?. ? ?  ? ?Past Medical History:  ?Diagnosis Date  ? GERD (gastroesophageal reflux disease)   ?  ? ?Family History  ?Problem Relation Age of Onset  ? Multiple sclerosis Mother   ? Colon cancer Neg Hx   ? Rectal cancer Neg Hx   ? Stomach cancer Neg Hx   ? ? ? ?Current Outpatient Medications:  ?  omeprazole (PRILOSEC) 20 MG capsule, Take 1 capsule (20 mg total) by mouth daily. Take with breakfast or dinner x14 days., Disp: 60 capsule, Rfl: 0  ? ?No Known Allergies  ? ?Men's preventive visit. Patient Health Questionnaire (PHQ-2) is  ?[place] Office Visit from 06/01/2021 in [facility]  ?PHQ-2 Total Score 0  ? ?  ? ?Patient is on a Regular diet, limits his intake of fried food.  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  Marital status: Single. Relevant history for alcohol use is:  ?Social History  ? ?Substance and Sexual Activity  ?Alcohol Use Yes  ? Comment: 2 per day  ? ?Relevant history for tobacco use is:  ?Social History  ? ?Tobacco Use  ?Smoking Status Every Day  ? Packs/day: 0.65  ?  Years: 31.00  ? Pack years: 20.15  ? Types: Cigarettes  ?Smokeless Tobacco Never  ?.  ? ?Review of Systems  ?Constitutional: Negative.   ?HENT: Negative.    ?Eyes: Negative.   ?Respiratory: Negative.    ?Cardiovascular: Negative.   ?Gastrointestinal: Negative.   ?Endocrine: Negative.   ?Genitourinary: Negative.   ?Musculoskeletal: Negative.   ?Skin: Negative.   ?Allergic/Immunologic: Negative.   ?Neurological: Negative.   ?Hematological: Negative.   ?Psychiatric/Behavioral: Negative.     ? ?Today's Vitals  ? 09/29/21 1152 09/29/21 1230  ?BP: 128/88 (!) 120/98  ?Pulse: 63 (!) 59  ?Temp: 98.1 ?F (36.7 ?C)   ?SpO2: 97%   ?Weight: 177 lb (80.3 kg)   ?Height: 5' 8"  (1.727 m)   ? ?Body mass index is 26.91 kg/m?.  ?Wt Readings from Last 3 Encounters:  ?09/29/21 177 lb (80.3 kg)  ?06/01/21 172 lb 9.6 oz (78.3 kg)  ?12/23/20 185 lb (83.9 kg)  ?  ?Objective:  ?Physical Exam ?Vitals reviewed.  ?Constitutional:   ?   General: He is not in acute distress. ?   Appearance: Normal appearance.  ?HENT:  ?   Head: Normocephalic and atraumatic.  ?   Right Ear: Tympanic membrane, ear canal and external ear normal. There is no impacted cerumen.  ?   Left Ear: Tympanic membrane, ear canal and  external ear normal. There is no impacted cerumen.  ?   Nose:  ?   Comments: Deferred - masked ?   Mouth/Throat:  ?   Comments: Deferred - masked ?Eyes:  ?   Extraocular Movements: Extraocular movements intact.  ?   Conjunctiva/sclera: Conjunctivae normal.  ?   Pupils: Pupils are equal, round, and reactive to light.  ?Cardiovascular:  ?   Rate and Rhythm: Normal rate and regular rhythm.  ?   Pulses: Normal pulses.  ?   Heart sounds: Normal heart sounds. No murmur heard. ?Pulmonary:  ?   Effort: Pulmonary effort is normal. No respiratory distress.  ?   Breath sounds: Normal breath sounds.  ?Abdominal:  ?   General: Abdomen is flat. Bowel sounds are normal. There is no distension.  ?   Palpations: Abdomen is soft.  ?Genitourinary: ?   Comments:  Deferred - has established Urologist ?Musculoskeletal:     ?   General: No tenderness. Normal range of motion.  ?   Cervical back: Normal range of motion and neck supple. No tenderness.  ?Skin: ?   General: Skin is warm and dry.  ?   Capillary Refill: Capillary refill takes less than 2 seconds.  ?Neurological:  ?   General: No focal deficit present.  ?   Mental Status: He is alert and oriented to person, place, and time.  ?   Cranial Nerves: No cranial nerve deficit.  ?   Motor: No weakness.  ?Psychiatric:     ?   Mood and Affect: Mood normal.     ?   Behavior: Behavior normal.     ?   Thought Content: Thought content normal.     ?   Judgment: Judgment normal.  ?  ? ?   ?Assessment And Plan:  ?  ?1. Encounter for annual health examination ?Behavior modifications discussed and diet history reviewed.   ?Pt will continue to exercise regularly and modify diet with low GI, plant based foods and decrease intake of processed foods.  ?Recommend intake of daily multivitamin, Vitamin D, and calcium.  ?Recommend for preventive screenings, as well as recommend immunizations that include influenza, TDAP, ?- CMP14+EGFR ?- Lipid panel ?- CBC ?- Hemoglobin A1c ?- PSA ?- Hepatitis C Antibody ? ?2. History of COVID-19 ? ?3. Gastroesophageal reflux disease without esophagitis ?- omeprazole (PRILOSEC) 20 MG capsule; Take 1 capsule (20 mg total) by mouth daily. Take with breakfast or dinner x14 days.  Dispense: 60 capsule; Refill: 0 ? ? ? ? ? ?Patient was given opportunity to ask questions. Patient verbalized understanding of the plan and was able to repeat key elements of the plan. All questions were answered to their satisfaction.  ? ?Matthew Brine, FNP  ? ?I, Matthew Brine, FNP, have reviewed all documentation for this visit. The documentation on 03//23 for the exam, diagnosis, procedures, and orders are all accurate and complete.  ? ?THE PATIENT IS ENCOURAGED TO PRACTICE SOCIAL DISTANCING DUE TO THE COVID-19 PANDEMIC.   ?

## 2021-09-30 LAB — CBC
Hematocrit: 42.1 % (ref 37.5–51.0)
Hemoglobin: 14.3 g/dL (ref 13.0–17.7)
MCH: 32.5 pg (ref 26.6–33.0)
MCHC: 34 g/dL (ref 31.5–35.7)
MCV: 96 fL (ref 79–97)
Platelets: 156 10*3/uL (ref 150–450)
RBC: 4.4 x10E6/uL (ref 4.14–5.80)
RDW: 11.4 % — ABNORMAL LOW (ref 11.6–15.4)
WBC: 3.5 10*3/uL (ref 3.4–10.8)

## 2021-09-30 LAB — CMP14+EGFR
ALT: 16 IU/L (ref 0–44)
AST: 20 IU/L (ref 0–40)
Albumin/Globulin Ratio: 1.7 (ref 1.2–2.2)
Albumin: 4.4 g/dL (ref 3.8–4.9)
Alkaline Phosphatase: 92 IU/L (ref 44–121)
BUN/Creatinine Ratio: 11 (ref 9–20)
BUN: 13 mg/dL (ref 6–24)
Bilirubin Total: 0.5 mg/dL (ref 0.0–1.2)
CO2: 24 mmol/L (ref 20–29)
Calcium: 9.2 mg/dL (ref 8.7–10.2)
Chloride: 108 mmol/L — ABNORMAL HIGH (ref 96–106)
Creatinine, Ser: 1.15 mg/dL (ref 0.76–1.27)
Globulin, Total: 2.6 g/dL (ref 1.5–4.5)
Glucose: 88 mg/dL (ref 70–99)
Potassium: 4.4 mmol/L (ref 3.5–5.2)
Sodium: 142 mmol/L (ref 134–144)
Total Protein: 7 g/dL (ref 6.0–8.5)
eGFR: 77 mL/min/{1.73_m2} (ref >59)

## 2021-09-30 LAB — PSA: Prostate Specific Ag, Serum: 0.3 ng/mL (ref 0.0–4.0)

## 2021-09-30 LAB — LIPID PANEL
Chol/HDL Ratio: 2.7 ratio (ref 0.0–5.0)
Cholesterol, Total: 207 mg/dL — ABNORMAL HIGH (ref 100–199)
HDL: 77 mg/dL (ref >39)
LDL Chol Calc (NIH): 119 mg/dL — ABNORMAL HIGH (ref 0–99)
Triglycerides: 62 mg/dL (ref 0–149)
VLDL Cholesterol Cal: 11 mg/dL (ref 5–40)

## 2021-09-30 LAB — HEMOGLOBIN A1C
Est. average glucose Bld gHb Est-mCnc: 91 mg/dL
Hgb A1c MFr Bld: 4.8 % (ref 4.8–5.6)

## 2021-09-30 LAB — HEPATITIS C ANTIBODY: Hep C Virus Ab: NONREACTIVE

## 2021-10-08 ENCOUNTER — Ambulatory Visit: Payer: PRIVATE HEALTH INSURANCE | Admitting: Physician Assistant

## 2021-10-15 MED ORDER — OMEPRAZOLE 20 MG PO CPDR: 20.0000 mg | DELAYED_RELEASE_CAPSULE | Freq: Every day | ORAL | 0 refills | Status: AC

## 2022-04-11 ENCOUNTER — Ambulatory Visit: Payer: PRIVATE HEALTH INSURANCE | Admitting: Nurse Practitioner

## 2022-10-18 DIAGNOSIS — K219 Gastro-esophageal reflux disease without esophagitis: Secondary | ICD-10-CM | POA: Insufficient documentation

## 2022-10-19 ENCOUNTER — Encounter: Payer: PRIVATE HEALTH INSURANCE | Admitting: Nurse Practitioner

## 2023-02-11 IMAGING — CT CT CHEST LUNG CANCER SCREENING LOW DOSE W/O CM
2 series · 12 of 32 positions shown, 13 images · non-contrast
Comparison: None.

CLINICAL DATA: 50-year-old asymptomatic male current smoker with 35
pack-year smoking history.

EXAM:
CT CHEST WITHOUT CONTRAST LOW-DOSE FOR LUNG CANCER SCREENING
TECHNIQUE: Multidetector CT imaging of the chest was performed following the
standard protocol without IV contrast.

[Series 2: ldct screening <30 bmi · axial · 0.77mm/px · z∈[-486,-296]mm · 4 of 77 slices shown, 5 images]
[im 20/77  mediastinal]
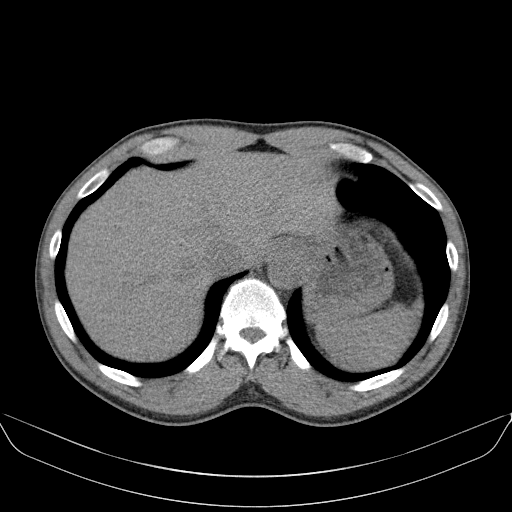
[im 20/77  lung]
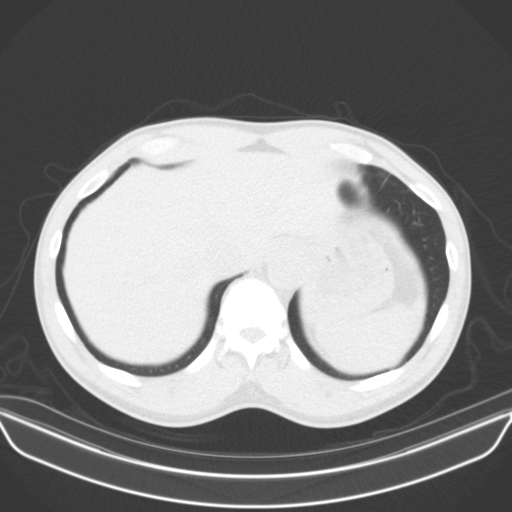
[im 37/77  lung]
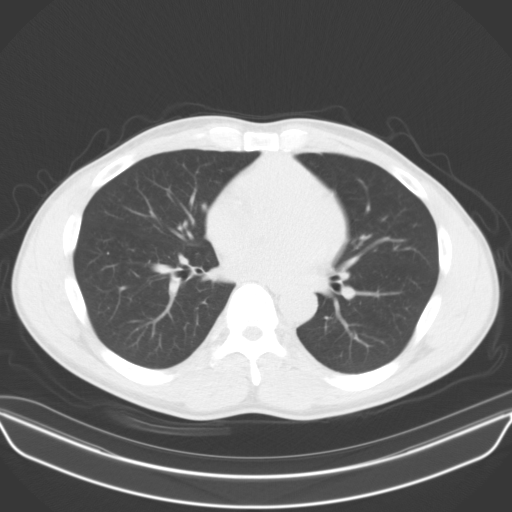
[im 39/77  lung]
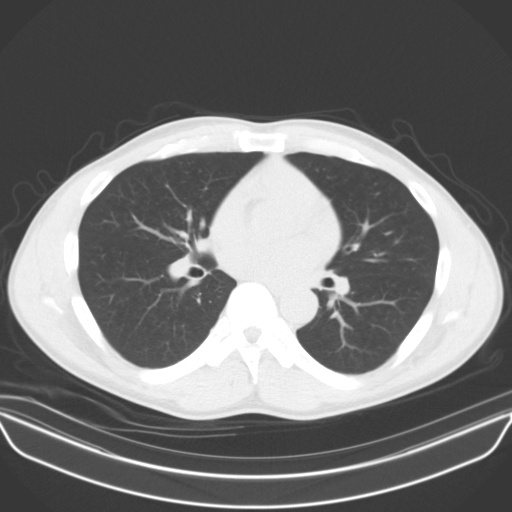
[im 58/77  lung]
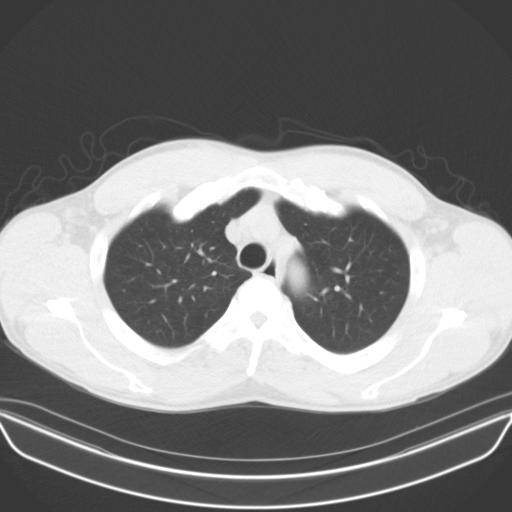

[Series 5: ldct screen lung · axial · 0.77mm/px · z∈[-494,-232]mm · 8 of 322 slices shown]
[im 30/322  lung]
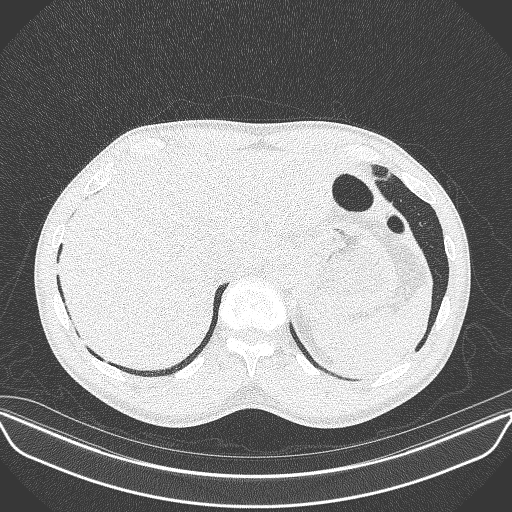
[im 73/322  lung]
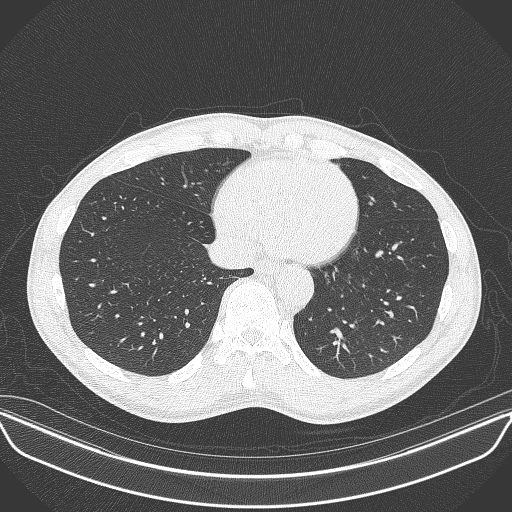
[im 103/322  lung]
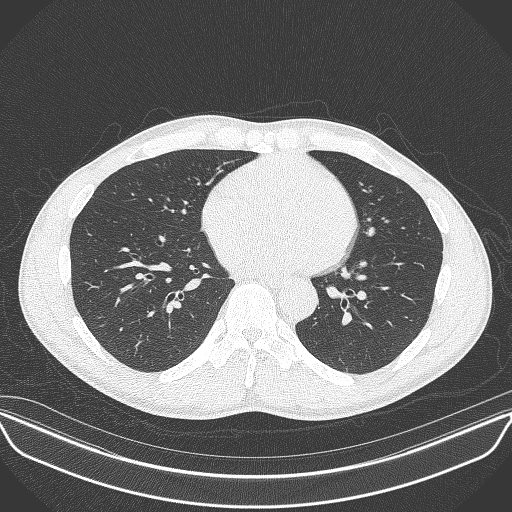
[im 132/322  lung]
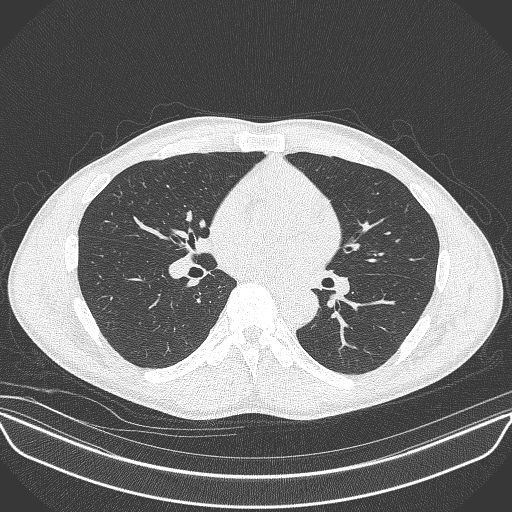
[im 176/322  lung]
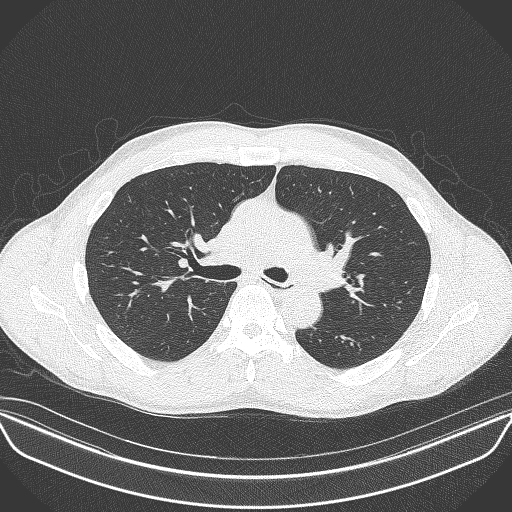
[im 219/322  lung]
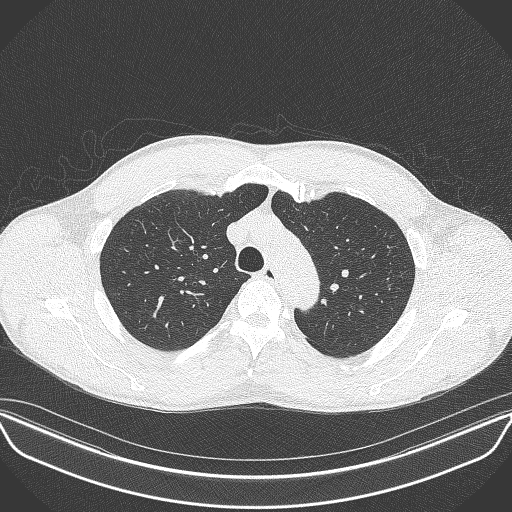
[im 249/322  lung]
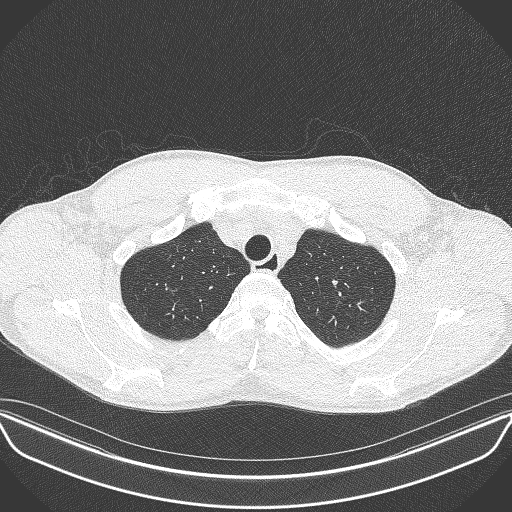
[im 292/322  lung]
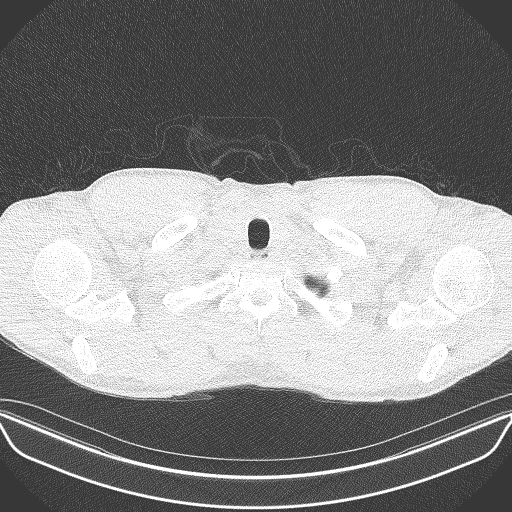

[12 of 32 positions shown; findings below may reference images not displayed]

FINDINGS: Cardiovascular: Normal heart size. No significant pericardial
effusion/thickening. Normal course and caliber of the great vessels.

Mediastinum/Nodes: No discrete thyroid nodules. Unremarkable
esophagus. No pathologically enlarged axillary, mediastinal or hilar
lymph nodes, noting limited sensitivity for the detection of hilar
adenopathy on this noncontrast study.

Lungs/Pleura: No pneumothorax. No pleural effusion. Mild
centrilobular emphysema with diffuse bronchial wall thickening. No
acute consolidative airspace disease or lung masses. No significant
pulmonary nodules.

Upper abdomen: No acute abnormality.

Musculoskeletal: No aggressive appearing focal osseous lesions. Mild
thoracic spondylosis. Symmetric mild bilateral gynecomastia.
IMPRESSION: 1. Lung-RADS 1, negative. Continue annual screening with low-dose
chest CT without contrast in 12 months.
2. Emphysema ([id number].V).
